# Patient Record
Sex: Male | Born: 1980 | Race: Black or African American | Hispanic: No | Marital: Single | Smoking: Current every day smoker
Health system: Southern US, Community
[De-identification: ages and names within clinical notes are randomized; demographics above are authoritative.]

## PROBLEM LIST (undated history)

## (undated) DIAGNOSIS — S21119A Laceration without foreign body of unspecified front wall of thorax without penetration into thoracic cavity, initial encounter: Secondary | ICD-10-CM

## (undated) DIAGNOSIS — D869 Sarcoidosis, unspecified: Secondary | ICD-10-CM

## (undated) DIAGNOSIS — S31119A Laceration without foreign body of abdominal wall, unspecified quadrant without penetration into peritoneal cavity, initial encounter: Secondary | ICD-10-CM

## (undated) HISTORY — PX: ABDOMINAL SURGERY: SHX537

---

## 2014-09-07 ENCOUNTER — Encounter (HOSPITAL_COMMUNITY): Payer: Self-pay | Admitting: *Deleted

## 2014-09-07 ENCOUNTER — Emergency Department (HOSPITAL_COMMUNITY): Payer: Self-pay

## 2014-09-07 ENCOUNTER — Emergency Department (HOSPITAL_COMMUNITY)
Admission: EM | Admit: 2014-09-07 | Discharge: 2014-09-07 | Disposition: A | Discharge status: Home / Self Care | Payer: Self-pay | Attending: Emergency Medicine | Admitting: Emergency Medicine

## 2014-09-07 DIAGNOSIS — Z862 Personal history of diseases of the blood and blood-forming organs and certain disorders involving the immune mechanism: Secondary | ICD-10-CM | POA: Insufficient documentation

## 2014-09-07 DIAGNOSIS — J209 Acute bronchitis, unspecified: Secondary | ICD-10-CM | POA: Insufficient documentation

## 2014-09-07 DIAGNOSIS — Z87828 Personal history of other (healed) physical injury and trauma: Secondary | ICD-10-CM | POA: Insufficient documentation

## 2014-09-07 DIAGNOSIS — J40 Bronchitis, not specified as acute or chronic: Secondary | ICD-10-CM

## 2014-09-07 DIAGNOSIS — Z720 Tobacco use: Secondary | ICD-10-CM | POA: Insufficient documentation

## 2014-09-07 HISTORY — DX: Laceration without foreign body of abdominal wall, unspecified quadrant without penetration into peritoneal cavity, initial encounter: S31.119A

## 2014-09-07 HISTORY — DX: Sarcoidosis, unspecified: D86.9

## 2014-09-07 HISTORY — DX: Laceration without foreign body of unspecified front wall of thorax without penetration into thoracic cavity, initial encounter: S21.119A

## 2014-09-07 LAB — CBC
HCT: 47.2 % (ref 39.0–52.0)
HEMOGLOBIN: 16.8 g/dL (ref 13.0–17.0)
MCH: 31.1 pg (ref 26.0–34.0)
MCHC: 35.6 g/dL (ref 30.0–36.0)
MCV: 87.2 fL (ref 78.0–100.0)
PLATELETS: 195 10*3/uL (ref 150–400)
RBC: 5.41 MIL/uL (ref 4.22–5.81)
RDW: 12.6 % (ref 11.5–15.5)
WBC: 9.2 10*3/uL (ref 4.0–10.5)

## 2014-09-07 LAB — BRAIN NATRIURETIC PEPTIDE: B Natriuretic Peptide: 14.8 pg/mL (ref 0.0–100.0)

## 2014-09-07 LAB — BASIC METABOLIC PANEL
ANION GAP: 11 (ref 5–15)
BUN: 6 mg/dL (ref 6–20)
CO2: 22 mmol/L (ref 22–32)
Calcium: 9.7 mg/dL (ref 8.9–10.3)
Chloride: 105 mmol/L (ref 101–111)
Creatinine, Ser: 0.88 mg/dL (ref 0.61–1.24)
GFR calc Af Amer: >60 mL/min (ref >60)
GFR calc non Af Amer: >60 mL/min (ref >60)
Glucose, Bld: 106 mg/dL — ABNORMAL HIGH (ref 65–99)
Potassium: 4.2 mmol/L (ref 3.5–5.1)
Sodium: 138 mmol/L (ref 135–145)

## 2014-09-07 LAB — I-STAT TROPONIN, ED: Troponin i, poc: 0 ng/mL (ref 0.00–0.08)

## 2014-09-07 LAB — D-DIMER, QUANTITATIVE (NOT AT ARMC): D-Dimer, Quant: <0.27 ug/mL-FEU (ref 0.00–0.48)

## 2014-09-07 MED ORDER — AEROCHAMBER PLUS W/MASK MISC
1.0000 | Freq: Once | Status: DC
  Filled 2014-09-07: qty 1

## 2014-09-07 MED ORDER — ALBUTEROL SULFATE (2.5 MG/3ML) 0.083% IN NEBU
5.0000 mg | INHALATION_SOLUTION | Freq: Once | RESPIRATORY_TRACT | Status: AC
  Administered 2014-09-07: 5 mg via RESPIRATORY_TRACT
  Filled 2014-09-07: qty 6

## 2014-09-07 MED ORDER — GUAIFENESIN ER 1200 MG PO TB12: 1.0000 | ORAL_TABLET | Freq: Two times a day (BID) | ORAL | Status: DC

## 2014-09-07 MED ORDER — ACETAMINOPHEN-CODEINE 120-12 MG/5ML PO SOLN: 10.0000 mL | ORAL | Status: DC | PRN

## 2014-09-07 MED ORDER — ALBUTEROL SULFATE HFA 108 (90 BASE) MCG/ACT IN AERS
2.0000 | INHALATION_SPRAY | RESPIRATORY_TRACT | Status: DC | PRN
  Administered 2014-09-07: 2 via RESPIRATORY_TRACT
  Filled 2014-09-07: qty 6.7

## 2014-09-07 MED ORDER — PREDNISONE 20 MG PO TABS
60.0000 mg | ORAL_TABLET | Freq: Once | ORAL | Status: AC
  Administered 2014-09-07: 60 mg via ORAL
  Filled 2014-09-07: qty 3

## 2014-09-07 MED ORDER — IPRATROPIUM BROMIDE 0.02 % IN SOLN
0.5000 mg | RESPIRATORY_TRACT | Status: AC
  Administered 2014-09-07: 0.5 mg via RESPIRATORY_TRACT
  Filled 2014-09-07: qty 2.5

## 2014-09-07 MED ORDER — PREDNISONE 50 MG PO TABS: 50.0000 mg | ORAL_TABLET | Freq: Every day | ORAL | Status: DC

## 2014-09-07 NOTE — ED Notes (Signed)
Pt in c/o chest pain that started yesterday, states yesterday the pain was to the right side of his chest and today moved to central chest, reports shortness of breath, denies n/v, pt states he has a history of sarcoidosis

## 2014-09-07 NOTE — Discharge Instructions (Signed)
Return here as needed.  Follow-up with a primary care doctor or an urgent care.  Increase your fluid intake and rest as much possible
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
## 2014-09-07 NOTE — ED Notes (Signed)
Pt also reports productive cough with clear mucus since this morning, denies fever

## 2016-04-11 ENCOUNTER — Encounter (HOSPITAL_COMMUNITY): Payer: Self-pay | Admitting: Emergency Medicine

## 2016-04-11 ENCOUNTER — Emergency Department (HOSPITAL_COMMUNITY): Payer: Self-pay

## 2016-04-11 ENCOUNTER — Emergency Department (HOSPITAL_COMMUNITY)
Admission: EM | Admit: 2016-04-11 | Discharge: 2016-04-11 | Disposition: A | Discharge status: Home / Self Care | Payer: Self-pay | Attending: Physician Assistant | Admitting: Physician Assistant

## 2016-04-11 DIAGNOSIS — R197 Diarrhea, unspecified: Secondary | ICD-10-CM | POA: Insufficient documentation

## 2016-04-11 DIAGNOSIS — F17210 Nicotine dependence, cigarettes, uncomplicated: Secondary | ICD-10-CM | POA: Insufficient documentation

## 2016-04-11 DIAGNOSIS — R112 Nausea with vomiting, unspecified: Secondary | ICD-10-CM | POA: Insufficient documentation

## 2016-04-11 LAB — COMPREHENSIVE METABOLIC PANEL
ALBUMIN: 4.8 g/dL (ref 3.5–5.0)
ALT: 22 U/L (ref 17–63)
AST: 19 U/L (ref 15–41)
Alkaline Phosphatase: 63 U/L (ref 38–126)
Anion gap: 6 (ref 5–15)
BILIRUBIN TOTAL: 1.2 mg/dL (ref 0.3–1.2)
BUN: 9 mg/dL (ref 6–20)
CALCIUM: 9.7 mg/dL (ref 8.9–10.3)
CO2: 29 mmol/L (ref 22–32)
Chloride: 101 mmol/L (ref 101–111)
Creatinine, Ser: 0.9 mg/dL (ref 0.61–1.24)
GFR calc Af Amer: >60 mL/min (ref >60)
GFR calc non Af Amer: >60 mL/min (ref >60)
GLUCOSE: 89 mg/dL (ref 65–99)
Potassium: 3.6 mmol/L (ref 3.5–5.1)
Sodium: 136 mmol/L (ref 135–145)
Total Protein: 8 g/dL (ref 6.5–8.1)

## 2016-04-11 LAB — CBC
HCT: 48.2 % (ref 39.0–52.0)
Hemoglobin: 16.8 g/dL (ref 13.0–17.0)
MCH: 29.9 pg (ref 26.0–34.0)
MCHC: 34.9 g/dL (ref 30.0–36.0)
MCV: 85.9 fL (ref 78.0–100.0)
Platelets: 219 10*3/uL (ref 150–400)
RBC: 5.61 MIL/uL (ref 4.22–5.81)
RDW: 12.5 % (ref 11.5–15.5)
WBC: 7.1 10*3/uL (ref 4.0–10.5)

## 2016-04-11 LAB — URINALYSIS, ROUTINE W REFLEX MICROSCOPIC
BILIRUBIN URINE: NEGATIVE
Glucose, UA: NEGATIVE mg/dL
HGB URINE DIPSTICK: NEGATIVE
KETONES UR: NEGATIVE mg/dL
Leukocytes, UA: NEGATIVE
Nitrite: NEGATIVE
Protein, ur: NEGATIVE mg/dL
Specific Gravity, Urine: 1.018 (ref 1.005–1.030)
pH: 9 — ABNORMAL HIGH (ref 5.0–8.0)

## 2016-04-11 LAB — LIPASE, BLOOD: Lipase: 29 U/L (ref 11–51)

## 2016-04-11 MED ORDER — IOPAMIDOL (ISOVUE-300) INJECTION 61%
INTRAVENOUS | Status: AC
  Administered 2016-04-11: 100 mL
  Filled 2016-04-11: qty 100

## 2016-04-11 MED ORDER — ALUM & MAG HYDROXIDE-SIMETH 200-200-20 MG/5ML PO SUSP: 30.0000 mL | Freq: Four times a day (QID) | ORAL | 0 refills | Status: DC | PRN

## 2016-04-11 MED ORDER — METOCLOPRAMIDE HCL 5 MG/ML IJ SOLN
10.0000 mg | Freq: Once | INTRAMUSCULAR | Status: AC
  Administered 2016-04-11: 10 mg via INTRAVENOUS
  Filled 2016-04-11: qty 2

## 2016-04-11 MED ORDER — GI COCKTAIL ~~LOC~~
30.0000 mL | Freq: Once | ORAL | Status: AC
  Administered 2016-04-11: 30 mL via ORAL
  Filled 2016-04-11: qty 30

## 2016-04-11 MED ORDER — ONDANSETRON HCL 4 MG PO TABS: 4.0000 mg | ORAL_TABLET | Freq: Three times a day (TID) | ORAL | 0 refills | Status: DC | PRN

## 2016-04-11 MED ORDER — SODIUM CHLORIDE 0.9 % IV BOLUS (SEPSIS)
1000.0000 mL | Freq: Once | INTRAVENOUS | Status: AC
  Administered 2016-04-11: 1000 mL via INTRAVENOUS

## 2016-04-11 MED ORDER — OXYCODONE-ACETAMINOPHEN 5-325 MG PO TABS
1.0000 | ORAL_TABLET | Freq: Once | ORAL | Status: AC
Start: 2016-04-11 — End: 2016-04-11
  Administered 2016-04-11: 1 via ORAL
  Filled 2016-04-11: qty 1
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
## 2016-04-11 NOTE — ED Notes (Signed)
ED Provider at bedside. 

## 2016-04-11 NOTE — Discharge Instructions (Signed)
Your labs are reassuring. Normal vital signs and her CAT scan showed evidence of likely viral gastroenteritis. If not improving in the next 48 hours. Return to the emergency department. Also return if you are unable to keep liquids down to keep herself hydrated. [place] if you are not able to pass gas/use the bathroom.

## 2016-04-11 NOTE — ED Triage Notes (Signed)
Pt c/o epigastric pain that started last night and has been constant.  Patient states that he has had BMs but not diarrhea. Patient denies any n/v.

## 2016-04-11 NOTE — ED Notes (Signed)
Pt has urinal at bedside 

## 2016-04-11 NOTE — ED Notes (Signed)
Patient made aware of urine sample. Urinal placed at bedside and patient encouraged to void when able. 

## 2017-02-03 ENCOUNTER — Emergency Department (HOSPITAL_COMMUNITY)
Admission: EM | Admit: 2017-02-03 | Discharge: 2017-02-04 | Disposition: A | Discharge status: Other Acute Inpatient Hospital | Payer: Self-pay | Attending: Emergency Medicine | Admitting: Emergency Medicine

## 2017-02-03 ENCOUNTER — Encounter (HOSPITAL_COMMUNITY): Payer: Self-pay | Admitting: Emergency Medicine

## 2017-02-03 ENCOUNTER — Emergency Department (HOSPITAL_COMMUNITY): Payer: Self-pay

## 2017-02-03 DIAGNOSIS — F17210 Nicotine dependence, cigarettes, uncomplicated: Secondary | ICD-10-CM | POA: Insufficient documentation

## 2017-02-03 DIAGNOSIS — Z046 Encounter for general psychiatric examination, requested by authority: Secondary | ICD-10-CM | POA: Insufficient documentation

## 2017-02-03 DIAGNOSIS — R0789 Other chest pain: Secondary | ICD-10-CM | POA: Insufficient documentation

## 2017-02-03 DIAGNOSIS — R45851 Suicidal ideations: Secondary | ICD-10-CM | POA: Insufficient documentation

## 2017-02-03 DIAGNOSIS — F1410 Cocaine abuse, uncomplicated: Secondary | ICD-10-CM | POA: Insufficient documentation

## 2017-02-03 LAB — BASIC METABOLIC PANEL
Anion gap: 13 (ref 5–15)
BUN: 8 mg/dL (ref 6–20)
CO2: 22 mmol/L (ref 22–32)
CREATININE: 0.84 mg/dL (ref 0.61–1.24)
Calcium: 9.4 mg/dL (ref 8.9–10.3)
Chloride: 104 mmol/L (ref 101–111)
GFR calc Af Amer: >60 mL/min (ref >60)
GLUCOSE: 204 mg/dL — AB (ref 65–99)
POTASSIUM: 3.6 mmol/L (ref 3.5–5.1)
SODIUM: 139 mmol/L (ref 135–145)

## 2017-02-03 LAB — RAPID URINE DRUG SCREEN, HOSP PERFORMED
Amphetamines: NOT DETECTED
Barbiturates: NOT DETECTED
Benzodiazepines: NOT DETECTED
Cocaine: POSITIVE — AB
OPIATES: NOT DETECTED
TETRAHYDROCANNABINOL: NOT DETECTED

## 2017-02-03 LAB — I-STAT TROPONIN, ED
TROPONIN I, POC: 0 ng/mL (ref 0.00–0.08)
Troponin i, poc: 0 ng/mL (ref 0.00–0.08)

## 2017-02-03 LAB — CBC
HEMATOCRIT: 48.9 % (ref 39.0–52.0)
Hemoglobin: 17.2 g/dL — ABNORMAL HIGH (ref 13.0–17.0)
MCH: 30.9 pg (ref 26.0–34.0)
MCHC: 35.2 g/dL (ref 30.0–36.0)
MCV: 87.9 fL (ref 78.0–100.0)
Platelets: 279 10*3/uL (ref 150–400)
RBC: 5.56 MIL/uL (ref 4.22–5.81)
RDW: 13 % (ref 11.5–15.5)
WBC: 7.2 10*3/uL (ref 4.0–10.5)

## 2017-02-03 LAB — ETHANOL: Alcohol, Ethyl (B): 41 mg/dL — ABNORMAL HIGH (ref <10)

## 2017-02-03 LAB — URINALYSIS, COMPLETE (UACMP) WITH MICROSCOPIC
Bilirubin Urine: NEGATIVE
Glucose, UA: NEGATIVE mg/dL
HGB URINE DIPSTICK: NEGATIVE
KETONES UR: NEGATIVE mg/dL
LEUKOCYTES UA: NEGATIVE
Nitrite: NEGATIVE
PROTEIN: NEGATIVE mg/dL
SQUAMOUS EPITHELIAL / LPF: NONE SEEN
Specific Gravity, Urine: 1.029 (ref 1.005–1.030)
pH: 5 (ref 5.0–8.0)

## 2017-02-03 LAB — SALICYLATE LEVEL: Salicylate Lvl: <7 mg/dL (ref 2.8–30.0)

## 2017-02-03 LAB — D-DIMER, QUANTITATIVE: D-Dimer, Quant: 0.31 ug/mL-FEU (ref 0.00–0.50)

## 2017-02-03 LAB — ACETAMINOPHEN LEVEL

## 2017-02-03 MED ORDER — LORAZEPAM 1 MG PO TABS
0.0000 mg | ORAL_TABLET | Freq: Four times a day (QID) | ORAL | Status: DC
  Administered 2017-02-04: 1 mg via ORAL
  Filled 2017-02-03: qty 1

## 2017-02-03 MED ORDER — VITAMIN B-1 100 MG PO TABS
100.0000 mg | ORAL_TABLET | Freq: Every day | ORAL | Status: DC
  Administered 2017-02-03 – 2017-02-04 (×2): 100 mg via ORAL
  Filled 2017-02-03 (×2): qty 1

## 2017-02-03 MED ORDER — THIAMINE HCL 100 MG/ML IJ SOLN: 100.0000 mg | Freq: Every day | INTRAMUSCULAR | Status: DC

## 2017-02-03 MED ORDER — LORAZEPAM 1 MG PO TABS
0.0000 mg | ORAL_TABLET | Freq: Two times a day (BID) | ORAL | Status: DC
Start: 2017-02-06 — End: 2017-02-04

## 2017-02-03 MED ORDER — ASPIRIN 81 MG PO CHEW
324.0000 mg | CHEWABLE_TABLET | Freq: Once | ORAL | Status: AC
  Administered 2017-02-03: 324 mg via ORAL
  Filled 2017-02-03: qty 4

## 2017-02-03 MED ORDER — LORAZEPAM 2 MG/ML IJ SOLN: 0.0000 mg | Freq: Two times a day (BID) | INTRAMUSCULAR | Status: DC

## 2017-02-03 MED ORDER — ACETAMINOPHEN 325 MG PO TABS: 650.0000 mg | ORAL_TABLET | ORAL | Status: DC | PRN

## 2017-02-03 MED ORDER — LORAZEPAM 2 MG/ML IJ SOLN: 0.0000 mg | Freq: Four times a day (QID) | INTRAMUSCULAR | Status: DC

## 2017-02-03 MED ORDER — ONDANSETRON HCL 4 MG PO TABS: 4.0000 mg | ORAL_TABLET | Freq: Three times a day (TID) | ORAL | Status: DC | PRN

## 2017-02-03 MED ORDER — ALUM & MAG HYDROXIDE-SIMETH 200-200-20 MG/5ML PO SUSP: 30.0000 mL | Freq: Four times a day (QID) | ORAL | Status: DC | PRN

## 2017-02-03 MED ORDER — LORAZEPAM 1 MG PO TABS
1.0000 mg | ORAL_TABLET | Freq: Once | ORAL | Status: AC
  Administered 2017-02-03: 1 mg via ORAL
  Filled 2017-02-03: qty 1

## 2017-02-03 NOTE — ED Notes (Signed)
Pt has 3 bags of belongings- 1)clothes, 2)coat, and 3)shoes with patient stickers on bags.  Bags were [place] in [place].
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
## 2017-02-03 NOTE — ED Triage Notes (Signed)
Pt. Came back into triage to make sure the doctor will evaluate his alcohol problem and is requesting help for alcohol problem Last drank yesterday . Drank 6, 40 oz. Beer

## 2017-02-03 NOTE — ED Notes (Signed)
PT came to see me again in the lobby, requesting help for his Si. Spoke to American FinancialKaren RN and requested to bring PT back to Triage.

## 2017-02-03 NOTE — ED Notes (Signed)
X-Ray Tech came to see me stating that the PT had spoken to him at he is feeling Si. Spoke to PT and confirmed that he is feeling Si and has been feeling like this for 3-4 days. Bought PT back to triage and placed in room 1
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
## 2017-02-03 NOTE — ED Triage Notes (Signed)
Pt. Stated, I started having chest pain today dinies any other symptoms

## 2017-02-03 NOTE — ED Notes (Signed)
Tech first brought pt from waiting room and reports that pt is suicidal.  RN spoke with pt and he states that he is suicidal but denies having a plan.

## 2017-02-04 ENCOUNTER — Inpatient Hospital Stay (HOSPITAL_COMMUNITY)
Admission: AD | Admit: 2017-02-04 | Discharge: 2017-02-14 | DRG: 885 | Payer: Federal, State, Local not specified - Other | Source: Intra-hospital | Attending: Psychiatry | Admitting: Psychiatry

## 2017-02-04 ENCOUNTER — Other Ambulatory Visit: Payer: Self-pay

## 2017-02-04 ENCOUNTER — Encounter (HOSPITAL_COMMUNITY): Payer: Self-pay

## 2017-02-04 DIAGNOSIS — Z23 Encounter for immunization: Secondary | ICD-10-CM

## 2017-02-04 DIAGNOSIS — D869 Sarcoidosis, unspecified: Secondary | ICD-10-CM | POA: Diagnosis present

## 2017-02-04 DIAGNOSIS — F332 Major depressive disorder, recurrent severe without psychotic features: Secondary | ICD-10-CM | POA: Diagnosis not present

## 2017-02-04 DIAGNOSIS — Z590 Homelessness: Secondary | ICD-10-CM

## 2017-02-04 DIAGNOSIS — F419 Anxiety disorder, unspecified: Secondary | ICD-10-CM | POA: Diagnosis present

## 2017-02-04 DIAGNOSIS — F17210 Nicotine dependence, cigarettes, uncomplicated: Secondary | ICD-10-CM | POA: Diagnosis not present

## 2017-02-04 DIAGNOSIS — Y902 Blood alcohol level of 40-59 mg/100 ml: Secondary | ICD-10-CM | POA: Diagnosis present

## 2017-02-04 DIAGNOSIS — Z8659 Personal history of other mental and behavioral disorders: Secondary | ICD-10-CM | POA: Diagnosis not present

## 2017-02-04 DIAGNOSIS — F1010 Alcohol abuse, uncomplicated: Secondary | ICD-10-CM | POA: Diagnosis not present

## 2017-02-04 DIAGNOSIS — F322 Major depressive disorder, single episode, severe without psychotic features: Secondary | ICD-10-CM | POA: Insufficient documentation

## 2017-02-04 DIAGNOSIS — F1410 Cocaine abuse, uncomplicated: Secondary | ICD-10-CM | POA: Diagnosis not present

## 2017-02-04 DIAGNOSIS — F329 Major depressive disorder, single episode, unspecified: Secondary | ICD-10-CM | POA: Diagnosis present

## 2017-02-04 DIAGNOSIS — R45851 Suicidal ideations: Secondary | ICD-10-CM | POA: Diagnosis present

## 2017-02-04 DIAGNOSIS — F323 Major depressive disorder, single episode, severe with psychotic features: Principal | ICD-10-CM | POA: Diagnosis present

## 2017-02-04 DIAGNOSIS — F1994 Other psychoactive substance use, unspecified with psychoactive substance-induced mood disorder: Secondary | ICD-10-CM | POA: Diagnosis present

## 2017-02-04 DIAGNOSIS — F1420 Cocaine dependence, uncomplicated: Secondary | ICD-10-CM | POA: Diagnosis not present

## 2017-02-04 DIAGNOSIS — F1020 Alcohol dependence, uncomplicated: Secondary | ICD-10-CM | POA: Diagnosis present

## 2017-02-04 DIAGNOSIS — Z813 Family history of other psychoactive substance abuse and dependence: Secondary | ICD-10-CM

## 2017-02-04 DIAGNOSIS — G4700 Insomnia, unspecified: Secondary | ICD-10-CM | POA: Diagnosis not present

## 2017-02-04 DIAGNOSIS — Z811 Family history of alcohol abuse and dependence: Secondary | ICD-10-CM

## 2017-02-04 DIAGNOSIS — F333 Major depressive disorder, recurrent, severe with psychotic symptoms: Secondary | ICD-10-CM | POA: Diagnosis not present

## 2017-02-04 MED ORDER — ONDANSETRON 4 MG PO TBDP: 4.0000 mg | ORAL_TABLET | Freq: Four times a day (QID) | ORAL | Status: AC | PRN

## 2017-02-04 MED ORDER — TRAZODONE HCL 50 MG PO TABS
50.0000 mg | ORAL_TABLET | Freq: Every evening | ORAL | Status: DC | PRN
  Administered 2017-02-04 – 2017-02-05 (×2): 50 mg via ORAL
  Filled 2017-02-04: qty 1

## 2017-02-04 MED ORDER — ALUM & MAG HYDROXIDE-SIMETH 200-200-20 MG/5ML PO SUSP
30.0000 mL | ORAL | Status: DC | PRN
  Administered 2017-02-05 – 2017-02-13 (×7): 30 mL via ORAL
  Filled 2017-02-04 (×7): qty 30

## 2017-02-04 MED ORDER — ACETAMINOPHEN 325 MG PO TABS: 650.0000 mg | ORAL_TABLET | Freq: Four times a day (QID) | ORAL | Status: DC | PRN

## 2017-02-04 MED ORDER — PNEUMOCOCCAL VAC POLYVALENT 25 MCG/0.5ML IJ INJ
0.5000 mL | INJECTION | INTRAMUSCULAR | Status: AC
  Administered 2017-02-05: 0.5 mL via INTRAMUSCULAR

## 2017-02-04 MED ORDER — LORAZEPAM 1 MG PO TABS: 1.0000 mg | ORAL_TABLET | Freq: Four times a day (QID) | ORAL | Status: AC | PRN

## 2017-02-04 MED ORDER — HYDROXYZINE HCL 25 MG PO TABS
25.0000 mg | ORAL_TABLET | Freq: Four times a day (QID) | ORAL | Status: AC | PRN
  Administered 2017-02-05 – 2017-02-07 (×2): 25 mg via ORAL
  Filled 2017-02-04 (×2): qty 1

## 2017-02-04 MED ORDER — ALUM & MAG HYDROXIDE-SIMETH 200-200-20 MG/5ML PO SUSP: 30.0000 mL | ORAL | Status: DC | PRN

## 2017-02-04 MED ORDER — MAGNESIUM HYDROXIDE 400 MG/5ML PO SUSP: 30.0000 mL | Freq: Every day | ORAL | Status: DC | PRN

## 2017-02-04 MED ORDER — NICOTINE 21 MG/24HR TD PT24
21.0000 mg | MEDICATED_PATCH | Freq: Every day | TRANSDERMAL | Status: DC
  Administered 2017-02-04 – 2017-02-13 (×10): 21 mg via TRANSDERMAL
  Filled 2017-02-04 (×2): qty 1
  Filled 2017-02-04: qty 14
  Filled 2017-02-04 (×12): qty 1

## 2017-02-04 MED ORDER — LORAZEPAM 1 MG PO TABS
1.0000 mg | ORAL_TABLET | Freq: Two times a day (BID) | ORAL | Status: AC
  Administered 2017-02-06 – 2017-02-07 (×2): 1 mg via ORAL
  Filled 2017-02-04 (×2): qty 1

## 2017-02-04 MED ORDER — INFLUENZA VAC SPLIT QUAD 0.5 ML IM SUSY
0.5000 mL | PREFILLED_SYRINGE | INTRAMUSCULAR | Status: AC
  Administered 2017-02-05: 0.5 mL via INTRAMUSCULAR
  Filled 2017-02-04: qty 0.5

## 2017-02-04 MED ORDER — LORAZEPAM 1 MG PO TABS
1.0000 mg | ORAL_TABLET | Freq: Three times a day (TID) | ORAL | Status: AC
  Administered 2017-02-05 – 2017-02-06 (×3): 1 mg via ORAL
  Filled 2017-02-04 (×3): qty 1

## 2017-02-04 MED ORDER — LORAZEPAM 1 MG PO TABS
1.0000 mg | ORAL_TABLET | Freq: Four times a day (QID) | ORAL | Status: AC
  Administered 2017-02-04 – 2017-02-05 (×4): 1 mg via ORAL
  Filled 2017-02-04 (×4): qty 1

## 2017-02-04 MED ORDER — LOPERAMIDE HCL 2 MG PO CAPS
2.0000 mg | ORAL_CAPSULE | ORAL | Status: AC | PRN
  Administered 2017-02-04: 4 mg via ORAL
  Filled 2017-02-04: qty 2

## 2017-02-04 MED ORDER — ADULT MULTIVITAMIN W/MINERALS CH
1.0000 | ORAL_TABLET | Freq: Every day | ORAL | Status: DC
  Administered 2017-02-04 – 2017-02-13 (×10): 1 via ORAL
  Filled 2017-02-04 (×15): qty 1

## 2017-02-04 MED ORDER — VITAMIN B-1 100 MG PO TABS
100.0000 mg | ORAL_TABLET | Freq: Every day | ORAL | Status: DC
  Administered 2017-02-05 – 2017-02-13 (×9): 100 mg via ORAL
  Filled 2017-02-04 (×5): qty 1
  Filled 2017-02-04: qty 14
  Filled 2017-02-04 (×8): qty 1

## 2017-02-04 MED ORDER — ACETAMINOPHEN 325 MG PO TABS
650.0000 mg | ORAL_TABLET | Freq: Four times a day (QID) | ORAL | Status: DC | PRN
  Administered 2017-02-04 – 2017-02-08 (×5): 650 mg via ORAL
  Filled 2017-02-04 (×5): qty 2

## 2017-02-04 MED ORDER — LORAZEPAM 1 MG PO TABS
1.0000 mg | ORAL_TABLET | Freq: Every day | ORAL | Status: AC
  Administered 2017-02-08: 1 mg via ORAL
  Filled 2017-02-04: qty 1

## 2017-02-04 NOTE — Progress Notes (Signed)
Patient ID: Cody Meyer, male   DOB: [birth year], 36 y.o.   MRN: [id number]  Pt currently presents with a blunted affect and cooperative, guarded behavior. Presents with signs and symptoms of withdrawal including fatigue, generalized aches and diarrhea. Pt reports to writer that their goal is to "feel better." Pt forwards little to Clinical research associatewriter during interaction. Pt reports good sleep with current medication regimen.   Pt provided with scheduled and as needed medications per providers orders. Pt's labs and vitals were monitored throughout the night. Pt given a 1:1 about emotional and mental status. Pt supported and encouraged to express concerns and questions. Pt educated on medications and withdrawal symptoms.  Pt's safety ensured with 15 minute and environmental checks. Pt currently denies SI/HI and A/V hallucinations. Pt verbally agrees to seek staff if SI/HI or A/VH occurs and to consult with staff before acting on any harmful thoughts.  Will continue POC.

## 2017-02-04 NOTE — Progress Notes (Signed)
Leslie Andreaerlie is a 36 year old male being admitted voluntarily to 307-1 from [facility].  He came into the ED for suicidal ideation, increase in depression, alcohol use and cocaine use.  He reported plan to jump into traffic.  He has medical history of Sarcoidosis.  He is diagnosed with Major Depressive Disorder, Alcohol Use Disorder and Stimulant-related Disorder(cocaine).  [facility] he denied SI/HI or A/V hallucinations.  He reported that he was just getting tired of using and "I can't keep up anymore."  He admitted using alcohol and cocaine daily and being homeless has his stressors.  He reported head ache 9/10 since coming to the hospital.   Oriented him to the unit.  Admission paperwork completed and signed.  Belongings searched and secured in locker # 38.  Skin assessment completed and no skin issues noted.  Q 15 minute checks initiated for safety.  We will monitor the progress towards his goals.

## 2017-02-04 NOTE — Progress Notes (Signed)
Per Berneice Heinrichina Tate , [facility] ; Accepting provider is Hillery Jacksanika Lewis, NP; Attending provider is Dr. Jama Flavorsobos.  Patient can arrive at 1:00pm. Number for report is [phone].   Lew DawesKelly Moon, RN notified.    Baldo DaubJolan Maile Linford MSW, LCSWA CSW Disposition [phone]

## 2017-02-04 NOTE — Tx Team (Signed)
Initial Treatment Plan 02/04/2017 3:45 PM Cody Meyer ZOX:[id number]    PATIENT STRESSORS: Financial difficulties Health problems Substance abuse   PATIENT STRENGTHS: Capable of independent living Communication skills General fund of knowledge Work skills   PATIENT IDENTIFIED PROBLEMS: Depression  Suicidal ideation  Substance abuse  "Get my life straight"  "Get off drugs and alcohol"             DISCHARGE CRITERIA:  Improved stabilization in mood, thinking, and/or behavior Verbal commitment to aftercare and medication compliance Withdrawal symptoms are absent or subacute and managed without 24-hour nursing intervention  PRELIMINARY DISCHARGE PLAN: Outpatient therapy Medication management  PATIENT/FAMILY INVOLVEMENT: This treatment plan has been presented to and reviewed with the patient, Cody LangePerlie Meyer.  The patient and family have been given the opportunity to ask questions and make suggestions.  Levin BaconHeather V Adley Castello, RN 02/04/2017, 3:45 PM

## 2017-02-04 NOTE — ED Notes (Signed)
Behavioral health recommends inpatient treatment.  [facility] on [facility]5 after 10am.

## 2017-02-05 DIAGNOSIS — R450 Nervousness: Secondary | ICD-10-CM

## 2017-02-05 DIAGNOSIS — F419 Anxiety disorder, unspecified: Secondary | ICD-10-CM

## 2017-02-05 DIAGNOSIS — F1010 Alcohol abuse, uncomplicated: Secondary | ICD-10-CM

## 2017-02-05 DIAGNOSIS — F17210 Nicotine dependence, cigarettes, uncomplicated: Secondary | ICD-10-CM

## 2017-02-05 DIAGNOSIS — R4587 Impulsiveness: Secondary | ICD-10-CM

## 2017-02-05 DIAGNOSIS — G4700 Insomnia, unspecified: Secondary | ICD-10-CM

## 2017-02-05 DIAGNOSIS — F1020 Alcohol dependence, uncomplicated: Secondary | ICD-10-CM

## 2017-02-05 DIAGNOSIS — R4582 Worries: Secondary | ICD-10-CM

## 2017-02-05 DIAGNOSIS — F1410 Cocaine abuse, uncomplicated: Secondary | ICD-10-CM

## 2017-02-05 LAB — LIPID PANEL
CHOL/HDL RATIO: 3.2 ratio
Cholesterol: 175 mg/dL (ref 0–200)
HDL: 55 mg/dL (ref >40)
LDL CALC: 103 mg/dL — AB (ref 0–99)
Triglycerides: 85 mg/dL (ref <150)
VLDL: 17 mg/dL (ref 0–40)

## 2017-02-05 LAB — HEMOGLOBIN A1C
Hgb A1c MFr Bld: 5.7 % — ABNORMAL HIGH (ref 4.8–5.6)
Mean Plasma Glucose: 116.89 mg/dL

## 2017-02-05 LAB — TSH: TSH: 2.259 u[IU]/mL (ref 0.350–4.500)

## 2017-02-05 MED ORDER — ALBUTEROL SULFATE HFA 108 (90 BASE) MCG/ACT IN AERS
1.0000 | INHALATION_SPRAY | Freq: Four times a day (QID) | RESPIRATORY_TRACT | Status: DC | PRN
  Administered 2017-02-05 – 2017-02-13 (×14): 2 via RESPIRATORY_TRACT
  Filled 2017-02-05: qty 6.7

## 2017-02-05 MED ORDER — FLUOXETINE HCL 20 MG PO CAPS
20.0000 mg | ORAL_CAPSULE | Freq: Every day | ORAL | Status: DC
  Administered 2017-02-05 – 2017-02-13 (×9): 20 mg via ORAL
  Filled 2017-02-05 (×13): qty 1

## 2017-02-05 NOTE — BHH Suicide Risk Assessment (Signed)
[facility]   Nursing information obtained from:  Patient Demographic factors:  Male, Low socioeconomic status Current Mental Status:  Self-harm thoughts Loss Factors:  Financial problems / change in socioeconomic status Historical Factors:  Family history of suicide, Family history of mental illness or substance abuse Risk Reduction Factors:  Employed  Total Time spent with patient: 30 minutes Principal Problem: MDD (major depressive disorder) Diagnosis:   Patient Active Problem List   Diagnosis Date Noted  . Severe major depression (HCC) [F32.2] 02/04/2017  . MDD (major depressive disorder) [F32.9] 02/04/2017   Subjective Data:  Cody Meyer is a 36 y/o M with history of MDD and substance use of cocaine and alcohol who presented with worsening depression and SI with plan to jump into traffic. Pt shares his reasons for coming to the hospital, stating, "I was having chest pain, so I went to the hosiptal, but I was also feeling real down and having thoughts about jumping out in the street." Pt associates his SI with guilty feelings and ongoing struggles with substance use, which pt indicates he has struggled with since his teens. He typically drinks about five or six 40 ounce beers per day and he uses cocaine when available. He denies current SI/HI/AH/VH. He reports recent stressor of losing his housing and being homeless for the past 2 days. He has not tried psychotropic medications before but he is open to a trial at this time. He is open to referral to substance treatment, and he thinks that a residential treatment would be the most successful option for him. Discussed with patient about treatment options. He agreed to trial of prozac to address his mood symptoms. We also discussed starting the CIWA protocol as pt has had symptoms of withdrawal in the past and his last use of alcohol was about 24 hours before admission. Pt had no further questions, comments, or  concerns.   Continued Clinical Symptoms:  Alcohol Use Disorder Identification Test Final Score (AUDIT): 35 The "Alcohol Use Disorders Identification Test", Guidelines for Use in Primary Care, Second Edition.  World Science writerHealth Organization [facility] Low(WHO). Score between 0-7:  no or low risk or alcohol related problems. Score between 8-15:  moderate risk of alcohol related problems. Score between 16-19:  high risk of alcohol related problems. Score 20 or above:  warrants further diagnostic evaluation for alcohol dependence and treatment.   CLINICAL FACTORS:   Depression:   Comorbid alcohol abuse/dependence Alcohol/Substance Abuse/Dependencies   Musculoskeletal: Strength & Muscle Tone: within normal limits Gait & Station: normal Patient leans: N/A  Psychiatric Specialty Exam: Physical Exam  Nursing note and vitals reviewed.   Review of Systems  Constitutional: Negative for chills and fever.  Respiratory: Negative for cough.   Gastrointestinal: Negative for heartburn, nausea and vomiting.  Psychiatric/Behavioral: Negative for depression, hallucinations and suicidal ideas. The patient is not nervous/anxious.     Blood pressure 123/78, pulse 96, temperature 98.1 F (36.7 C), temperature source Oral, resp. rate 18, height 5' 6.5" (1.689 m), weight 77.8 kg (171 lb 8 oz).Body mass index is 27.27 kg/m.  General Appearance: Casual and Fairly Groomed  Eye Contact:  Good  Speech:  Clear and Coherent and Normal Rate  Volume:  Normal  Mood:  Anxious and Depressed  Affect:  Congruent and Constricted  Thought Process:  Coherent and Goal Directed  Orientation:  Full (Time, Place, and Person)  Thought Content:  Logical  Suicidal Thoughts:  No  Homicidal Thoughts:  No  Memory:  Immediate;  Good Recent;   Good Remote;   Good  Judgement:  Fair  Insight:  Lacking  Psychomotor Activity:  Normal  Concentration:  Concentration: Good  Recall:  Good  Fund of Knowledge:  Good  Language:  Fair   Akathisia:  No  Handed:    AIMS (if indicated):     Assets:  Communication Skills Leisure Time Physical Health Resilience Social Support  ADL's:  Intact  Cognition:  WNL  Sleep:  Number of Hours: 4.5      COGNITIVE FEATURES THAT CONTRIBUTE TO RISK:  None    SUICIDE RISK:   Mild:  Suicidal ideation of limited frequency, intensity, duration, and specificity.  There are no identifiable plans, no associated intent, mild dysphoria and related symptoms, good self-control (both objective and subjective assessment), few other risk factors, and identifiable protective factors, including available and accessible social support.  PLAN OF CARE:  - Admit to the inpatient psychiatry unit - MDD  - Start prozac 20mg  qDay - Alcohol withdrawal  - Start CIWA protocol with ativan - Encourage participation in groups and the therapeutic milieu - Discharge planning will be ongoing  I certify that inpatient services furnished can reasonably be expected to improve the patient's condition.   Micheal Likens, MD 02/05/2017, 4:12 PM

## 2017-02-05 NOTE — Progress Notes (Signed)
Recreation Therapy Notes  Animal-Assisted Activity (AAA) Program Checklist/Progress Notes Patient Eligibility Criteria Checklist & Daily Group note for Rec TxIntervention  Date: 11.06.2018 Time: 2:45pm [facility]   AAA/T Program Assumption of Risk Form signed by Patient/ or Parent Legal Guardian Yes  Patient is free of allergies or sever asthma Yes  Patient reports no fear of animals Yes  Patient reports no history of cruelty to animals Yes  Patient understands his/her participation is voluntary Yes  Behavioral Response: Did not attend.   Marykay Lexenise L Mistey Hoffert, LRT/CTRS        Cheyeanne Roadcap L 02/05/2017 2:55 PM
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
## 2017-02-05 NOTE — Progress Notes (Signed)
Patient ID: Cody Meyer, male   DOB: 23-Sep-1980, 36 y.o.   MRN: [id number]  Pt currently presents with a brighter affect and anxious behavior. Pt seen interacting appropriately with peers in the group room tonight. Pt states "it was a good day." Pt denies any current diarrhea but endorses intermittent gas. Pt reports good sleep with current medication regimen.   Pt provided with medications per providers orders. Pt's labs and vitals were monitored throughout the night. Pt given a 1:1 about emotional and mental status. Pt supported and encouraged to express concerns and questions. Pt educated on medications.  Pt's safety ensured with 15 minute and environmental checks. Pt currently denies SI/HI and A/V hallucinations. Pt verbally agrees to seek staff if SI/HI or A/VH occurs and to consult with staff before acting on any harmful thoughts. Will continue POC.

## 2017-02-05 NOTE — [facility] Suicide Risk Assessment (Signed)
[facility] INPATIENT:  Family/Significant Other Suicide Prevention Education  Suicide Prevention Education:  Patient Refusal for Family/Significant Other Suicide Prevention Education: The patient Cody Meyer has refused to provide written consent for family/significant other to be provided Family/Significant Other Suicide Prevention Education during admission and/or prior to discharge.  Physician notified.  SPE completed with pt, as pt refused to consent to family contact. SPI pamphlet provided to pt and pt was encouraged to share information with support network, ask questions, and talk about any concerns relating to SPE. Pt denies access to guns/firearms and verbalized understanding of information provided. Mobile Crisis information also provided to pt.   Myrlene Riera N Smart LCSW 02/05/2017, 4:02 PM

## 2017-02-05 NOTE — Progress Notes (Signed)
Dar Note: Patient presents with flat affect and depressed mood.  Denies auditory and visual hallucinations.  Medications given as prescribed.  Reports withdrawal symptoms of irritability, anxiety, headache during assessment.  Patient visible in the dayroom with minimal interaction with staff and peers.  Routine safety checks continues.  Attended group with minimal participation.  Support and encouragement offered as needed. Patient is safe on the unit.
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
## 2017-02-05 NOTE — Progress Notes (Signed)
Adult Psychoeducational Group Note  Date:  02/05/2017 Time:  4:14 AM  Group Topic/Focus:  Wrap-Up Group:   The focus of this group is to help patients review their daily goal of treatment and discuss progress on daily workbooks.  Participation Level:  Active  Participation Quality:  Appropriate  Affect:  Appropriate  Cognitive:  Appropriate  Insight: Appropriate  Engagement in Group:  Engaged  Modes of Intervention:  Discussion  Additional Comments:  Pt attended the AA group this evening.  Cody FurnaceChristopher  Ewen Meyer 02/05/2017, 4:14 AM

## 2017-02-05 NOTE — BHH Group Notes (Signed)
LCSW Group Therapy Note   02/05/2017 1:15pm   Type of Therapy and Topic:  Group Therapy:  Overcoming Obstacles   Participation Level:  Minimal   Description of Group:    In this group patients will be encouraged to explore what they see as obstacles to their own wellness and recovery. They will be guided to discuss their thoughts, feelings, and behaviors related to these obstacles. The group will process together ways to cope with barriers, with attention given to specific choices patients can make. Each patient will be challenged to identify changes they are motivated to make in order to overcome their obstacles. This group will be process-oriented, with patients participating in exploration of their own experiences as well as giving and receiving support and challenge from other group members.   Therapeutic Goals: 1. Patient will identify personal and current obstacles as they relate to admission. 2. Patient will identify barriers that currently interfere with their wellness or overcoming obstacles.  3. Patient will identify feelings, thought process and behaviors related to these barriers. 4. Patient will identify two changes they are willing to make to overcome these obstacles:      Summary of Patient Progress   Cody Meyer was attentive and engaged during today's processing group. He shared that he has an upcoming "civil court case" and is worried about missing court. He is also interested in residential treatment. Cody Meyer was receptive to options discussed in the group setting and reports that he is currently homeless and unemployed. HE continues to demonstrate improving insight and progress in the group setting.    Therapeutic Modalities:   Cognitive Behavioral Therapy Solution Focused Therapy Motivational Interviewing Relapse Prevention Therapy  Ledell PeoplesHeather N Smart, LCSW 02/05/2017 3:36 PM

## 2017-02-06 DIAGNOSIS — F1020 Alcohol dependence, uncomplicated: Secondary | ICD-10-CM

## 2017-02-06 DIAGNOSIS — F332 Major depressive disorder, recurrent severe without psychotic features: Secondary | ICD-10-CM

## 2017-02-06 MED ORDER — TRAZODONE HCL 100 MG PO TABS
100.0000 mg | ORAL_TABLET | Freq: Every day | ORAL | Status: DC
  Administered 2017-02-06 – 2017-02-08 (×3): 100 mg via ORAL
  Filled 2017-02-06 (×6): qty 1

## 2017-02-06 NOTE — Progress Notes (Signed)
DAR NOTE: Pt present with flat affect and depressed mood in the unit. Pt has been isolating himself and has been bed most of the time. Pt denies physical pain, took all his meds as scheduled. As per self inventory, pt had a fair night sleep, fair appetite, low energy, and poor concentration. Pt rate depression at 7, hopeless ness at 4, and anxiety at 3. Pt's safety ensured with 15 minute and environmental checks. Pt currently denies SI/HI and A/V hallucinations. Pt verbally agrees to seek staff if SI/HI or A/VH occurs and to consult with staff before acting on these thoughts. Will continue POC.

## 2017-02-06 NOTE — [facility] Group Notes (Signed)
[facility] Therapy 02/06/2017 1:15pm  Type of Therapy: Mental Health Association Presentation  Participation Level: Active  Participation Quality: Attentive  Affect: Appropriate  Cognitive: Oriented  Insight: Developing/Improving  Engagement in Therapy: Engaged  Modes of Intervention: Discussion, Education and Socialization  Summary of Progress/Problems: Mental Health Association (MHA) Speaker came to talk about his personal journey with mental health. The pt processed ways by which to relate to the speaker. MHA speaker provided handouts and educational information pertaining to groups and services offered by the [facility]. Pt was engaged in speaker's presentation and was receptive to resources provided.    Pulte HomesHeather N Smart, LCSW 02/06/2017 4:13 PM

## 2017-02-06 NOTE — Progress Notes (Signed)
Adult Psychoeducational Group Note  Date:  02/06/2017 Time:  5:17 AM  Group Topic/Focus:  Wrap-Up Group:   The focus of this group is to help patients review their daily goal of treatment and discuss progress on daily workbooks.  Participation Level:  Active  Participation Quality:  Appropriate  Affect:  Appropriate  Cognitive:  Appropriate  Insight: Appropriate and Good  Engagement in Group:  Engaged  Modes of Intervention:  Discussion  Additional Comments:  Pt stated his goal for today was to interact more with his peers. Pt stated he accomplished his goal today. Pt rated his overall day a 10. Pt stated his goal for tomorrow was to talk with his doctor about his medication.  Felipa FurnaceChristopher  Navdeep Fessenden 02/06/2017, 5:17 AM

## 2017-02-06 NOTE — Tx Team (Signed)
Interdisciplinary Treatment and Diagnostic Plan Update  02/06/2017 Time of Session: 0830AM Edi Gorniak MRN: [id number]  Principal Diagnosis: MDD (major depressive disorder)  Secondary Diagnoses: Principal Problem:   MDD (major depressive disorder) Active Problems:   Alcohol use disorder, severe, dependence ([place])   Current Medications:  Current Facility-Administered Medications  Medication Dose Route Frequency Provider Last Rate Last Dose  . acetaminophen (TYLENOL) tablet 650 mg  650 mg Oral Q6H PRN Derrill Center, NP   650 mg at 02/05/17 0831  . albuterol (PROVENTIL HFA;VENTOLIN HFA) 108 (90 Base) MCG/ACT inhaler 1-2 puff  1-2 puff Inhalation Q6H PRN Pennelope Bracken, MD   2 puff at 02/05/17 2243  . alum & mag hydroxide-simeth (MAALOX/MYLANTA) 200-200-20 MG/5ML suspension 30 mL  30 mL Oral Q4H PRN Derrill Center, NP   30 mL at 02/05/17 2108  . FLUoxetine (PROZAC) capsule 20 mg  20 mg Oral Daily Nwoko, Herbert Pun I, NP   20 mg at 02/06/17 0845  . hydrOXYzine (ATARAX/VISTARIL) tablet 25 mg  25 mg Oral Q6H PRN Lindon Romp A, NP   25 mg at 02/05/17 2106  . loperamide (IMODIUM) capsule 2-4 mg  2-4 mg Oral PRN Lindon Romp A, NP   4 mg at 02/04/17 2130  . LORazepam (ATIVAN) tablet 1 mg  1 mg Oral Q6H PRN Lindon Romp A, NP      . LORazepam (ATIVAN) tablet 1 mg  1 mg Oral TID Lindon Romp A, NP   1 mg at 02/06/17 0845   Followed by  . LORazepam (ATIVAN) tablet 1 mg  1 mg Oral BID Rozetta Nunnery, NP       Followed by  . [START ON 02/08/2017] LORazepam (ATIVAN) tablet 1 mg  1 mg Oral Daily Lindon Romp A, NP      . magnesium hydroxide (MILK OF MAGNESIA) suspension 30 mL  30 mL Oral Daily PRN Derrill Center, NP      . multivitamin with minerals tablet 1 tablet  1 tablet Oral Daily Lindon Romp A, NP   1 tablet at 02/06/17 0845  . nicotine (NICODERM CQ - dosed in mg/24 hours) patch 21 mg  21 mg Transdermal Daily Cobos, Myer Peer, MD   21 mg at 02/06/17 0845  . ondansetron (ZOFRAN-ODT)  disintegrating tablet 4 mg  4 mg Oral Q6H PRN Lindon Romp A, NP      . thiamine (VITAMIN B-1) tablet 100 mg  100 mg Oral Daily Lindon Romp A, NP   100 mg at 02/06/17 0845  . traZODone (DESYREL) tablet 50 mg  50 mg Oral QHS PRN Derrill Center, NP   50 mg at 02/05/17 2106   PTA Medications: Medications Prior to Admission  Medication Sig Dispense Refill Last Dose  . alum & mag hydroxide-simeth (MAALOX/MYLANTA) 200-200-20 MG/5ML suspension Take 30 mLs by mouth every 6 (six) hours as needed for indigestion or heartburn. (Patient not taking: Reported on 02/03/2017) 355 mL 0 Completed Course at Unknown time  . ondansetron (ZOFRAN) 4 MG tablet Take 1 tablet (4 mg total) by mouth every 8 (eight) hours as needed for nausea or vomiting. (Patient not taking: Reported on 02/03/2017) 11 tablet 0 Completed Course at Unknown time    Patient Stressors: Financial difficulties Health problems Substance abuse  Patient Strengths: Capable of independent living [employer]ator fund of knowledge Work skills  Treatment Modalities: Medication Management, Group therapy, Case management,  1 to 1 session with clinician, Psychoeducation, Recreational therapy.   Physician Treatment  Plan for Primary Diagnosis: MDD (major depressive disorder) Long Term Goal(s): Improvement in symptoms so as ready for discharge Improvement in symptoms so as ready for discharge   Short Term Goals: Ability to identify changes in lifestyle to reduce recurrence of condition will improve Ability to disclose and discuss suicidal ideas Ability to demonstrate self-control will improve Ability to identify and develop effective coping behaviors will improve Compliance with prescribed medications will improve Ability to identify triggers associated with substance abuse/mental health issues will improve  Medication Management: Evaluate patient's response, side effects, and tolerance of medication regimen.  Therapeutic  Interventions: 1 to 1 sessions, Unit Group sessions and Medication administration.  Evaluation of Outcomes: Not Met  Physician Treatment Plan for Secondary Diagnosis: Principal Problem:   MDD (major depressive disorder) Active Problems:   Alcohol use disorder, severe, dependence ([place])  Long Term Goal(s): Improvement in symptoms so as ready for discharge Improvement in symptoms so as ready for discharge   Short Term Goals: Ability to identify changes in lifestyle to reduce recurrence of condition will improve Ability to disclose and discuss suicidal ideas Ability to demonstrate self-control will improve Ability to identify and develop effective coping behaviors will improve Compliance with prescribed medications will improve Ability to identify triggers associated with substance abuse/mental health issues will improve     Medication Management: Evaluate patient's response, side effects, and tolerance of medication regimen.  Therapeutic Interventions: 1 to 1 sessions, Unit Group sessions and Medication administration.  Evaluation of Outcomes: Not Met   RN Treatment Plan for Primary Diagnosis: MDD (major depressive disorder) Long Term Goal(s): Knowledge of disease and therapeutic regimen to maintain health will improve  Short Term Goals: Ability to remain free from injury will improve, Ability to disclose and discuss suicidal ideas and Ability to identify and develop effective coping behaviors will improve  Medication Management: RN will administer medications as ordered by provider, will assess and evaluate patient's response and provide education to patient for prescribed medication. RN will report any adverse and/or side effects to prescribing provider.  Therapeutic Interventions: 1 on 1 counseling sessions, Psychoeducation, Medication administration, Evaluate responses to treatment, Monitor vital signs and CBGs as ordered, Perform/monitor CIWA, COWS, AIMS and Fall Risk screenings as  ordered, Perform wound care treatments as ordered.  Evaluation of Outcomes: Progressing   LCSW Treatment Plan for Primary Diagnosis: MDD (major depressive disorder) Long Term Goal(s): Safe transition to appropriate next level of care at discharge, Engage patient in therapeutic group addressing interpersonal concerns.  Short Term Goals: Engage patient in aftercare planning with referrals and resources, Facilitate patient progression through stages of change regarding substance use diagnoses and concerns and Identify triggers associated with mental health/substance abuse issues  Therapeutic Interventions: Assess for all discharge needs, 1 to 1 time with Social worker, Explore available resources and support systems, Assess for adequacy in community support network, Educate family and significant other(s) on suicide prevention, Complete Psychosocial Assessment, Interpersonal group therapy.  Evaluation of Outcomes: Progressing   Progress in Treatment: Attending groups: Yes. Participating in groups: Yes. Taking medication as prescribed: Yes. Toleration medication: Yes. Family/Significant other contact made: SPE completed with pt; pt declined to consent to family contact.  Patient understands diagnosis: Yes. Discussing patient identified problems/goals with staff: Yes. Medical problems stabilized or resolved: Yes. Denies suicidal/homicidal ideation: Yes. Issues/concerns per patient self-inventory: No. Other: n/a   New problem(s) identified: No, Describe:  n/a  New Short Term/Long Term Goal(s): detox, medication management for mood stabilization, development of comprehensive mental wellness/sobriety plan.  Discharge Plan or Barriers: CSW assessing for appropriate referrals. Pt referred to [facility] and plans to attend [facility] for outpatient mental health care. [place] pamphlet and NA/AA list provided for [facility] for additional referrals.   Reason for Continuation of  Hospitalization: Anxiety Depression Medication stabilization Withdrawal symptoms  Estimated Length of Stay: Friday, 02/08/17  Attendees: Patient: 02/06/2017 9:16 AM  Physician: Dr. Sanjuana Letters MD; Dr. Nancy Fetter MD 02/06/2017 9:16 AM  Nursing: Trinna Post RN; Opal Sidles RN 02/06/2017 9:16 AM  RN Care Manager: Lars Pinks CM 02/06/2017 9:16 AM  Social Worker:  Maxie Better, LCSW 02/06/2017 9:16 AM  Recreational Therapist: x 02/06/2017 9:16 AM  Other: Lindell Spar NP; Darnelle Maffucci Money NP 02/06/2017 9:16 AM  Other:  02/06/2017 9:16 AM  Other: 02/06/2017 9:16 AM    Scribe for Treatment Team: Pross[place], LCSW 02/06/2017 9:16 AM

## 2017-02-06 NOTE — Progress Notes (Signed)
Recreation Therapy Notes  Date: 02/06/17 Time: 0930 Location: [address] Dayroom  Group Topic: Stress Management  Goal Area(s) Addresses:  Patient will verbalize importance of using healthy stress management.  Patient will identify positive emotions associated with healthy stress management.   Intervention: Stress Management  Activity :  Guided Imagery.  LRT introduced the stress management technique of guided imagery.  LRT read Meyer script to allow patients to take Meyer mental vacation to escape their daily routine for Meyer few minutes.  Education: Stress Management, Discharge Planning.   Education Outcome: Acknowledges edcuation/In group clarification offered/Needs additional education  Clinical Observations/Feedback: Pt did not attend group.    Cody Meyer, LRT/CTRS          Cody Meyer 02/06/2017 12:02 PM 

## 2017-02-06 NOTE — Progress Notes (Signed)
Nursing Progress Note [id number]  D) Patient presents calm and cooperative. Patient reports he is not feeling well and complains of a sore throat. Patient is observed up in the dayroom this evening and attended group. Patient provided snacks this evening. Patient requested use of his inhaler and tylenol for general malaise. Patient denies SI/HI/AVH. Patient contracts for safety on the unit. Patient compliant with sleep medications. Patient reports his head "feels stuffy". Temperature obtained this evening and was 98.7 F.   A) Emotional support given. 1:1 interaction and active listening provided. Patient medicated as prescribed. Medications and plan of care reviewed with patient. Patient verbalized understanding without further questions. Snacks and fluids provided. Opportunities for questions or concerns presented to patient. Patient encouraged to continue to work on treatment goals. Labs, vital signs and patient behavior monitored throughout shift. Patient safety maintained with q15 min safety checks. Low fall risk precautions in place and reviewed with patient; patient verbalized understanding.  R) Patient receptive to interaction with nurse. Patient remains safe on the unit at this time. Patient denies any adverse medication reactions at this time. Patient is resting in bed without complaints. Will continue to monitor.
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
## 2017-02-07 LAB — RAPID STREP SCREEN (MED CTR MEBANE ONLY): STREPTOCOCCUS, GROUP A SCREEN (DIRECT): NEGATIVE

## 2017-02-07 MED ORDER — NITROGLYCERIN 0.4 MG SL SUBL: 0.4000 mg | SUBLINGUAL_TABLET | SUBLINGUAL | Status: DC | PRN

## 2017-02-07 MED ORDER — RISPERIDONE 1 MG PO TABS
1.0000 mg | ORAL_TABLET | Freq: Every day | ORAL | Status: DC
  Administered 2017-02-07 – 2017-02-13 (×7): 1 mg via ORAL
  Filled 2017-02-07 (×10): qty 1

## 2017-02-07 MED ORDER — PHENOL 1.4 % MT LIQD
1.0000 | OROMUCOSAL | Status: DC | PRN
  Administered 2017-02-07 – 2017-02-09 (×9): 1 via OROMUCOSAL
  Filled 2017-02-07: qty 177
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
## 2017-02-07 NOTE — Progress Notes (Signed)
D:Pt has been in bed this morning and was reluctant to get out of bed to take his morning scheduled medications. Pt c/o a sore throat and was given prn tylenol. Reported sore throat to MD and NP for evaluation. Pt has mild detox symptoms this morning. A:Offered support, encouragement and 15 minute checks. R:Pt denies si and hi. Safety maintained on the unit.

## 2017-02-07 NOTE — Progress Notes (Signed)
Nursing Progress Note 1900-0730  D) Patient presents with depressed mood but brightens upon interaction with peers/staff. Patient did attended group and was observed up playing spades with peers in the dayroom. Patient continues to report a sore throat and requests use of his medicated spray. Patient compliant with scheduled medications. Patient denies chest pain at this time. Patient provided first dose of Risperdal and verbalized understanding of medication. Patient denies SI/HI/AVH at time of assessment. Patient contracts for safety on the unit. Patient denies withdrawal symptoms at this time; CIWA score 2 for mild anxiety.  A) Emotional support given. 1:1 interaction and active listening provided. Patient medicated as prescribed. Medications and plan of care reviewed with patient. Patient verbalized understanding without further questions. Snacks and fluids provided. Opportunities for questions or concerns presented to patient. Patient encouraged to continue to work on treatment goals. Labs, vital signs and patient behavior monitored throughout shift. Patient safety maintained with q15 min safety checks. Low fall risk precautions in place and reviewed with patient; patient verbalized understanding.  R) Patient receptive to interaction with nurse. Patient remains safe on the unit at this time. Patient denies any adverse medication reactions at this time. Patient is resting in bed without complaints. Will continue to monitor.

## 2017-02-07 NOTE — Progress Notes (Signed)
Pt c/o of chest pain and when asked to describe pain, pt reported that he has a lump on his chest. Writer observed chest and no lump observed visually or palpated. Reported to NP. Pt c/o sore throat and was given prn throat spray following strep test. Pt verbally denied si and hi thoughts this morning. Per self inventory, pt has passive si thoughts. Safety maintained on the unit.

## 2017-02-07 NOTE — [facility] Group Notes (Signed)
LCSW Group Therapy Note   02/07/2017 1:15pm   Type of Therapy and Topic:  Group Therapy:  Overcoming Obstacles   Participation Level:  Minimal   Description of Group:    In this group patients will be encouraged to explore what they see as obstacles to their own wellness and recovery. They will be guided to discuss their thoughts, feelings, and behaviors related to these obstacles. The group will process together ways to cope with barriers, with attention given to specific choices patients can make. Each patient will be challenged to identify changes they are motivated to make in order to overcome their obstacles. This group will be process-oriented, with patients participating in exploration of their own experiences as well as giving and receiving support and challenge from other group members.   Therapeutic Goals: 1. Patient will identify personal and current obstacles as they relate to admission. 2. Patient will identify barriers that currently interfere with their wellness or overcoming obstacles.  3. Patient will identify feelings, thought process and behaviors related to these barriers. 4. Patient will identify two changes they are willing to make to overcome these obstacles:      Summary of Patient Progress   Cody Meyer was attentive during group. He shared that [facility] but understands that he may have to take care of court obligations first. He continues to present with flat affect and blunted mood. Cody Meyer demonstrates improving insight and some progress in the group setting.    Therapeutic Modalities:   Cognitive Behavioral Therapy Solution Focused Therapy Motivational Interviewing Relapse Prevention Therapy  Ledell PeoplesHeather N Smart, LCSW 02/07/2017 12:16 PM

## 2017-02-07 NOTE — Progress Notes (Signed)
Pt has been flat/sad this evening with si thoughts. Pt would not discuss what type of plan he had and made reference to planning something when he leaves the hospital. Pt reluctantly contracted for safety. Pt reports that his father died three months ago and pt described his father as mean. He said that his mom passed away when he was 89six years old and his grandmother when he was [age]. Pt says that he has no friends or family support. He reports that [place]." He says that he had a job and was homeless spending his money on drugs.

## 2017-02-07 NOTE — Plan of Care (Signed)
  Safety: Goal: Periods of time without injury will increase Interventions: Patient is on q15 minute safety checks and low fall risk precautions. Patient contracts for safety on the unit Outcome: Patient remains safe at this time 02/07/2017 2244 - Progressing by Ferrel Loganollazo, Glendel Jaggers A, RN

## 2017-02-08 DIAGNOSIS — F333 Major depressive disorder, recurrent, severe with psychotic symptoms: Secondary | ICD-10-CM

## 2017-02-08 MED ORDER — LORATADINE 10 MG PO TABS
10.0000 mg | ORAL_TABLET | Freq: Every day | ORAL | Status: DC
  Administered 2017-02-09 – 2017-02-13 (×5): 10 mg via ORAL
  Filled 2017-02-08 (×10): qty 1

## 2017-02-08 NOTE — Progress Notes (Signed)
Recreation Therapy Notes  Date: 02/08/17 Time: 0930 Location: 300 Hall Group Room  Group Topic: Stress Management  Goal Area(s) Addresses:  Patient will verbalize importance of using healthy stress management.  Patient will identify positive emotions associated with healthy stress management.   Intervention: Stress Management  Activity :  Progressive Muscle Relaxation.  LRT introduced the stress management technique of progressive muscle relaxation.  LRT lead the group the exercise of tensing and relaxing each muscle group individually.  Education:  Stress Management, Discharge Planning.   Education Outcome: Acknowledges edcuation/In group clarification offered/Needs additional education  Clinical Observations/Feedback: Pt did not attend group.   Keriana Sarsfield, LRT/CTRS         Jurline Folger A 02/08/2017 11:48 AM 

## 2017-02-08 NOTE — Plan of Care (Signed)
Safety Care Plan Documentation  Goal: Periods of time without injury will increase Intervention: Patient is on q15 minute safety checks and low fall risk precautions. Patient verbally contracts for safety on the unit. Outcome: Patient remains safe at this time  02/08/2017 2353 - Progressing by Ferrel Loganollazo, Rabecka Brendel A, RN

## 2017-02-08 NOTE — Progress Notes (Signed)
D Pt is OOB AUL ont eh [facility] today..he toelrates this well. HE is not forthcoming with his issues and /or his feelings. HE sits quietly in the dayroom. A HE completed hisd aily assessment and on this he wrote he has experienced SI today but he contracts with this writer to not hurt himslef today. He rates his depression, hopelessness and anxeity " 7/6/6/", respectively. He uses chloroseptic throat spary for c/o sore throat. R Safety is in place. He deneis withdrawal symptoms.

## 2017-02-08 NOTE — Progress Notes (Signed)
[facility] referral faxed. Pt has been accepted for screening and [facility]. However, he does not have anywhere safe to stay until screening. [facility] may have bed available sooner. Pt placed on waitlist.  Trula SladeHeather Smart, MSW, LCSW Clinical Social Worker 02/08/2017 12:30 PM

## 2017-02-08 NOTE — Progress Notes (Signed)
Nursing Progress Note 1900-0730  D) Patient presents with improved brightened affect and pleasant mood. Patient reports anxiety and depression. Patient is observed up in the dayroom interactive with peers. Patient denies SI/HI/AVH or pain. Patient contracts for safety on the unit. Patient reports sleeping well with current regimen. Patient compliant with medications and provided PRNs as ordered. Patient is free of withdrawal symptoms tonight.  A) Emotional support given. 1:1 interaction and active listening provided. Patient medicated as prescribed. Medications and plan of care reviewed with patient. Patient verbalized understanding without further questions. Snacks and fluids provided. Opportunities for questions or concerns presented to patient. Patient encouraged to continue to work on treatment goals. Labs, vital signs and patient behavior monitored throughout shift. Patient safety maintained with q15 min safety checks. Low fall risk precautions in place and reviewed with patient; patient verbalized understanding.  R) Patient receptive to interaction with nurse. Patient remains safe on the unit at this time. Patient denies any adverse medication reactions at this time. Patient is resting in bed without complaints. Will continue to monitor.
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
## 2017-02-08 NOTE — [facility] Group Notes (Signed)
LCSW Group Therapy Note  02/08/2017 1:15pm  Type of Therapy and Topic:  Group Therapy:  Feelings around Relapse and Recovery  Participation Level:  Active   Description of Group:    Patients in this group will discuss emotions they experience before and after a relapse. They will process how experiencing these feelings, or avoidance of experiencing them, relates to having a relapse. Facilitator will guide patients to explore emotions they have related to recovery. Patients will be encouraged to process which emotions are more powerful. They will be guided to discuss the emotional reaction significant others in their lives may have to their relapse or recovery. Patients will be assisted in exploring ways to respond to the emotions of others without this contributing to a relapse.  Therapeutic Goals: 1. Patient will identify two or more emotions that lead to a relapse for them 2. Patient will identify two emotions that result when they relapse 3. Patient will identify two emotions related to recovery 4. Patient will demonstrate ability to communicate their needs through discussion and/or role plays   Summary of Patient Progress:  Cody Meyer was attentive and engaged during today's processing group. He was receptive to information provided about PAWS. Cody Meyer states that he never tried to remain sober for more than a few days but thinks that he is already experiencing cravings and psychological post acute symptoms. He was interested in completing his safety plan. Pt continues to demonstrate improving insight and progress in the group setting.    Therapeutic Modalities:   Cognitive Behavioral Therapy Solution-Focused Therapy Assertiveness Training Relapse Prevention Therapy   Ledell PeoplesHeather N Smart, LCSW 02/08/2017 2:01 PM

## 2017-02-09 MED ORDER — PREDNISONE 10 MG (21) PO TBPK
20.0000 mg | ORAL_TABLET | Freq: Every evening | ORAL | Status: AC
  Administered 2017-02-10: 20 mg via ORAL

## 2017-02-09 MED ORDER — PREDNISONE 10 MG (21) PO TBPK
10.0000 mg | ORAL_TABLET | Freq: Four times a day (QID) | ORAL | Status: DC
  Administered 2017-02-11 – 2017-02-13 (×9): 10 mg via ORAL

## 2017-02-09 MED ORDER — PREDNISONE 10 MG (21) PO TBPK
10.0000 mg | ORAL_TABLET | Freq: Three times a day (TID) | ORAL | Status: AC
  Administered 2017-02-10 (×3): 10 mg via ORAL

## 2017-02-09 MED ORDER — PREDNISONE 10 MG (21) PO TBPK
20.0000 mg | ORAL_TABLET | Freq: Every morning | ORAL | Status: AC
  Administered 2017-02-10: 20 mg via ORAL
  Filled 2017-02-09: qty 21

## 2017-02-09 MED ORDER — PREDNISONE 10 MG (21) PO TBPK
10.0000 mg | ORAL_TABLET | ORAL | Status: AC
  Administered 2017-02-10: 10 mg via ORAL

## 2017-02-09 MED ORDER — TRAZODONE HCL 50 MG PO TABS
ORAL_TABLET | ORAL | Status: AC
  Filled 2017-02-09: qty 1

## 2017-02-09 MED ORDER — PREDNISONE 10 MG (21) PO TBPK
10.0000 mg | ORAL_TABLET | ORAL | Status: AC
  Administered 2017-02-10: 10 mg via ORAL
  Filled 2017-02-09: qty 21

## 2017-02-09 MED ORDER — TRAZODONE HCL 150 MG PO TABS
150.0000 mg | ORAL_TABLET | Freq: Every day | ORAL | Status: DC
  Administered 2017-02-09 – 2017-02-13 (×5): 150 mg via ORAL
  Filled 2017-02-09 (×7): qty 1
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
## 2017-02-09 NOTE — Progress Notes (Signed)
Patient ID: Cody Meyer, male   DOB: 12-30-1980, 36 y.o.   MRN: [id number]  Pt currently presents with a flat affect and cooperative  behavior. Pt reports to Clinical research associatewriter that is current concern is "all this pain in my throat." Reports pain with swallowing and pain from "swollen lymph node" in his L hip. Reports that he was taking a "steriod" when he lived in [place] for his Sarcoidosis. He concerns that throat pain is related to medical diagnosis. Pt states "It's just worrying me." Pt reports good sleep with current medication regimen.   Pt provided with medications per providers orders. Pt's labs and vitals were monitored throughout the night. Pt given a 1:1 about emotional and mental status. Pt supported and encouraged to express concerns and questions. Pt educated on medications. Provider consulted about patients concerns. Allyson SabalBerry, NP places orders for a Prednisone taper. Verbal orders for patient to follow up with [facility] for medication management. Will consider ordering an imaging appointment.   Pt's safety ensured with 15 minute and environmental checks. Pt currently denies SI/HI and A/V hallucinations. Pt verbally agrees to seek staff if SI/HI or A/VH occurs and to consult with staff before acting on any harmful thoughts. Pt verbalizes understanding of Providers orders. Will continue POC.
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
## 2017-02-09 NOTE — [facility] Group Notes (Signed)
[facility] LCSW Group Therapy Note  02/09/2017  @ 10:15 AM  Type of Therapy and Topic:  Group Therapy: Avoiding Self-Sabotaging and Enabling Behaviors  Participation Level:  Active   Description of Group The main focus of today's process group to discuss what "self-sabotage" means and use motivational iInterviewing to discuss what benefits, negative or positive, were involved in a self-identified self-sabotaging behavior. We then talked about reasons the patient may want to change the behavior and their current desire to change.   Summary of Patient Progress: Patient identified with patterns of behaviors he engages in that are not in his own best interests such as self medication and negative peer group. Patient is invested in self care as evidenced by his commitment to obtain further inpatient treatment, attend AA support groups, follow up on neglected needs such as dentistry and medical check ups. Patient reports motivation will be to find joy in his days.    Therapeutic molalities: Cognitive Behavioral Therapy Person-Centered Therapy Motivational Interviewing  Therapeutic Goals: 1. Patients will demonstrate understanding of the concept of self sabotage 2. Patients will be able to identify pros and cons of their behaviors 3. Patients will be able to identify at least two motivating factors for l of their desire for change   Carney Bernatherine C Harrill, LCSW

## 2017-02-09 NOTE — Progress Notes (Addendum)
D Pt is seen at the med window first thing this morning, as he takes his daily meds. HE makes good eye contact. A HE completes his daily assessment and on this he wrote he denied having SI within the past 24 hours and he rated his feelings of depression, hopelessness and anxiety " 7/6/5", respectively. HE is seen sitting quietly in the dayroom, but he does not socialize with other patients. HE gives quick, one-word answers to this writer's assessment questions. R Safety is in place.

## 2017-02-10 LAB — CULTURE, GROUP A STREP (THRC)
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
## 2017-02-10 NOTE — Progress Notes (Signed)
Patient did attend the evening speaker AA meeting.  

## 2017-02-10 NOTE — Progress Notes (Addendum)
Patient ID: Charlann Langeerlie Spangler, male   DOB: September 20, 1980, 36 y.o.   MRN: [id number]  Pt currently presents with a blunted affect and cooperative behavior. Pt behavior is attention seeking, Technical sales engineeroverheard writer asking another patient if they would like a drink to which he states, "hmmhmm, I am parched, really thirsty." Pt reports to writer that [facility] Pt reports good sleep with current medication regimen. Reports that his throat and hip do not hurt as much today after starting the Prednisone.  Pt provided with medications per providers orders. Pt's labs and vitals were monitored throughout the night. Pt given a 1:1 about emotional and mental status. Pt supported and encouraged to express concerns and questions. Pt educated on medications.  Pt's safety ensured with 15 minute and environmental checks. Pt currently denies SI/HI and A/V hallucinations. Pt verbally agrees to seek staff if SI/HI or A/VH occurs and to consult with staff before acting on any harmful thoughts. Will continue POC.

## 2017-02-10 NOTE — [facility] Group Notes (Signed)
[facility] LCSW Group Therapy Note    02/10/2017 10:15 AM   Type of Therapy and Topic: Group Therapy: Feelings Around Returning Home & Establishing a Supportive Framework    Participation Level: Did not attend; invited to participate yet did not despite overhead announcement and encouragement by staff   Catherine C Harrill, LCSW 

## 2017-02-11 DIAGNOSIS — D869 Sarcoidosis, unspecified: Secondary | ICD-10-CM

## 2017-02-11 MED ORDER — LOPERAMIDE HCL 2 MG PO CAPS
2.0000 mg | ORAL_CAPSULE | ORAL | Status: DC | PRN
  Administered 2017-02-11 – 2017-02-13 (×3): 2 mg via ORAL
  Filled 2017-02-11 (×4): qty 1

## 2017-02-11 NOTE — [facility] Group Notes (Signed)
LCSW Group Therapy Note   02/11/2017 1:15pm   Type of Therapy and Topic:  Group Therapy:  Overcoming Obstacles   Participation Level:  Did Not Attend-pt invited. Chose to remain in bed.    Description of Group:    In this group patients will be encouraged to explore what they see as obstacles to their own wellness and recovery. They will be guided to discuss their thoughts, feelings, and behaviors related to these obstacles. The group will process together ways to cope with barriers, with attention given to specific choices patients can make. Each patient will be challenged to identify changes they are motivated to make in order to overcome their obstacles. This group will be process-oriented, with patients participating in exploration of their own experiences as well as giving and receiving support and challenge from other group members.   Therapeutic Goals: 1. Patient will identify personal and current obstacles as they relate to admission. 2. Patient will identify barriers that currently interfere with their wellness or overcoming obstacles.  3. Patient will identify feelings, thought process and behaviors related to these barriers. 4. Patient will identify two changes they are willing to make to overcome these obstacles:      Summary of Patient Progress      Therapeutic Modalities:   Cognitive Behavioral Therapy Solution Focused Therapy Motivational Interviewing Relapse Prevention Therapy  Ledell PeoplesHeather N Smart, LCSW 02/11/2017 12:22 PM

## 2017-02-11 NOTE — Progress Notes (Signed)
Adult Psychoeducational Group Note  Date:  02/11/2017 Time:  8:55 PM  Group Topic/Focus:  Wrap-Up Group:   The focus of this group is to help patients review their daily goal of treatment and discuss progress on daily workbooks.  Participation Level:  Active  Participation Quality:  Appropriate  Affect:  Appropriate  Cognitive:  Appropriate  Insight: Appropriate  Engagement in Group:  Engaged  Modes of Intervention:  Discussion  Additional Comments:  The patient attended wrap-up group.The patient also said that he rates today 7.  Octavio Mannshigpen, Masaki Rothbauer Lee 02/11/2017, 8:55 PM
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
## 2017-02-11 NOTE — Progress Notes (Signed)
Recreation Therapy Notes  Date: 02/11/17 Time: 0930 Location: [address] Dayroom  Group Topic: Stress Management  Goal Area(s) Addresses:  Patient will verbalize importance of using healthy stress management.  Patient will identify positive emotions associated with healthy stress management.   Intervention: Stress Management  Activity :  Meditation.  LRT introduced the stress management technique of meditation.  LRT played a meditation from the Calm app that allowed patients to focus on the things they are grateful for.  Education:  Stress Management, Discharge Planning.   Education Outcome: Acknowledges edcuation/In group clarification offered/Needs additional education  Clinical Observations/Feedback: Pt did not attend group.    Caroll RancherMarjette Keyshla Tunison, LRT/CTRS      Caroll RancherLindsay, Rendell Thivierge A 02/11/2017 12:13 PM

## 2017-02-11 NOTE — Progress Notes (Signed)
DAR NOTE: Pt present with bright affect and calm mood in the unit. Pt has been visible in the dayroom, complained stomatch upset, maalox given with a little bit of relief, due to this , pt refused dinner . Pt denies physical pain, took all his meds as scheduled. As per self inventory, pt had a fair night sleep, good appetite, normal energy, and good concentration. Pt rate depression at 5, hopeless ness at 5, and anxiety at 4. Pt's safety ensured with 15 minute and environmental checks. Pt currently denies SI/HI and A/V hallucinations. Pt verbally agrees to seek staff if SI/HI or A/VH occurs and to consult with staff before acting on these thoughts. Will continue POC.

## 2017-02-11 NOTE — Tx Team (Signed)
Interdisciplinary Treatment and Diagnostic Plan Update  02/11/2017 Time of Session: 0830AM Cody Meyer MRN: [id number]  Principal Diagnosis: MDD (major depressive disorder)  Secondary Diagnoses: Principal Problem:   MDD (major depressive disorder) Active Problems:   Alcohol use disorder, severe, dependence (HCC)   Current Medications:  Current Facility-Administered Medications  Medication Dose Route Frequency Provider Last Rate Last Dose  . acetaminophen (TYLENOL) tablet 650 mg  650 mg Oral Q6H PRN Oneta RackLewis, Tanika N, NP   650 mg at 02/08/17 2153  . albuterol (PROVENTIL HFA;VENTOLIN HFA) 108 (90 Base) MCG/ACT inhaler 1-2 puff  1-2 puff Inhalation Q6H PRN Micheal Likensainville, Christopher T, MD   2 puff at 02/10/17 2210  . alum & mag hydroxide-simeth (MAALOX/MYLANTA) 200-200-20 MG/5ML suspension 30 mL  30 mL Oral Q4H PRN Oneta RackLewis, Tanika N, NP   30 mL at 02/10/17 1943  . FLUoxetine (PROZAC) capsule 20 mg  20 mg Oral Daily Armandina StammerNwoko, Agnes I, NP   20 mg at 02/11/17 0824  . loratadine (CLARITIN) tablet 10 mg  10 mg Oral Daily Nira ConnBerry, Jason A, NP   10 mg at 02/11/17 0824  . magnesium hydroxide (MILK OF MAGNESIA) suspension 30 mL  30 mL Oral Daily PRN Oneta RackLewis, Tanika N, NP      . multivitamin with minerals tablet 1 tablet  1 tablet Oral Daily Nira ConnBerry, Jason A, NP   1 tablet at 02/11/17 0824  . nicotine (NICODERM CQ - dosed in mg/24 hours) patch 21 mg  21 mg Transdermal Daily Cobos, Fernando A, MD   21 mg at 02/11/17 0825  . nitroGLYCERIN (NITROSTAT) SL tablet 0.4 mg  0.4 mg Sublingual Q5 min PRN Nwoko, Agnes I, NP      . phenol (CHLORASEPTIC) mouth spray 1 spray  1 spray Mouth/Throat PRN Armandina StammerNwoko, Agnes I, NP   1 spray at 02/09/17 2037  . predniSONE (STERAPRED UNI-PAK 21 TAB) tablet 10 mg  10 mg Oral 4X daily taper Nira ConnBerry, Jason A, NP   10 mg at 02/11/17 0824  . risperiDONE (RISPERDAL) tablet 1 mg  1 mg Oral QHS Nwoko, Agnes I, NP   1 mg at 02/10/17 2208  . thiamine (VITAMIN B-1) tablet 100 mg  100 mg Oral Daily Nira ConnBerry,  Jason A, NP   100 mg at 02/11/17 0824  . traZODone (DESYREL) tablet 150 mg  150 mg Oral QHS Armandina StammerNwoko, Agnes I, NP   150 mg at 02/10/17 2208   PTA Medications: Medications Prior to Admission  Medication Sig Dispense Refill Last Dose  . alum & mag hydroxide-simeth (MAALOX/MYLANTA) 200-200-20 MG/5ML suspension Take 30 mLs by mouth every 6 (six) hours as needed for indigestion or heartburn. (Patient not taking: Reported on 02/03/2017) 355 mL 0 Completed Course at Unknown time  . ondansetron (ZOFRAN) 4 MG tablet Take 1 tablet (4 mg total) by mouth every 8 (eight) hours as needed for nausea or vomiting. (Patient not taking: Reported on 02/03/2017) 11 tablet 0 Completed Course at Unknown time    Patient Stressors: Financial difficulties Health problems Substance abuse  Patient Strengths: Capable of independent living Wellsite geologistCommunication skills General fund of knowledge Work skills  Treatment Modalities: Medication Management, Group therapy, Case management,  1 to 1 session with clinician, Psychoeducation, Recreational therapy.   Physician Treatment Plan for Primary Diagnosis: MDD (major depressive disorder) Long Term Goal(s): Improvement in symptoms so as ready for discharge Improvement in symptoms so as ready for discharge   Short Term Goals: Ability to identify changes in lifestyle to reduce recurrence of condition will  improve Ability to disclose and discuss suicidal ideas Ability to demonstrate self-control will improve Ability to identify and develop effective coping behaviors will improve Compliance with prescribed medications will improve Ability to identify triggers associated with substance abuse/mental health issues will improve  Medication Management: Evaluate patient's response, side effects, and tolerance of medication regimen.  Therapeutic Interventions: 1 to 1 sessions, Unit Group sessions and Medication administration.  Evaluation of Outcomes: Progressing   Physician Treatment  Plan for Secondary Diagnosis: Principal Problem:   MDD (major depressive disorder) Active Problems:   Alcohol use disorder, severe, dependence (HCC)  Long Term Goal(s): Improvement in symptoms so as ready for discharge Improvement in symptoms so as ready for discharge   Short Term Goals: Ability to identify changes in lifestyle to reduce recurrence of condition will improve Ability to disclose and discuss suicidal ideas Ability to demonstrate self-control will improve Ability to identify and develop effective coping behaviors will improve Compliance with prescribed medications will improve Ability to identify triggers associated with substance abuse/mental health issues will improve     Medication Management: Evaluate patient's response, side effects, and tolerance of medication regimen.  Therapeutic Interventions: 1 to 1 sessions, Unit Group sessions and Medication administration.  Evaluation of Outcomes: Progressing   RN Treatment Plan for Primary Diagnosis: MDD (major depressive disorder) Long Term Goal(s): Knowledge of disease and therapeutic regimen to maintain health will improve  Short Term Goals: Ability to remain free from injury will improve, Ability to disclose and discuss suicidal ideas and Ability to identify and develop effective coping behaviors will improve  Medication Management: RN will administer medications as ordered by provider, will assess and evaluate patient's response and provide education to patient for prescribed medication. RN will report any adverse and/or side effects to prescribing provider.  Therapeutic Interventions: 1 on 1 counseling sessions, Psychoeducation, Medication administration, Evaluate responses to treatment, Monitor vital signs and CBGs as ordered, Perform/monitor CIWA, COWS, AIMS and Fall Risk screenings as ordered, Perform wound care treatments as ordered.  Evaluation of Outcomes: Progressing   LCSW Treatment Plan for Primary Diagnosis:  MDD (major depressive disorder) Long Term Goal(s): Safe transition to appropriate next level of care at discharge, Engage patient in therapeutic group addressing interpersonal concerns.  Short Term Goals: Engage patient in aftercare planning with referrals and resources, Facilitate patient progression through stages of change regarding substance use diagnoses and concerns and Identify triggers associated with mental health/substance abuse issues  Therapeutic Interventions: Assess for all discharge needs, 1 to 1 time with Social worker, Explore available resources and support systems, Assess for adequacy in community support network, Educate family and significant other(s) on suicide prevention, Complete Psychosocial Assessment, Interpersonal group therapy.  Evaluation of Outcomes: Progressing  Progress in Treatment: Attending groups: Yes. Participating in groups: Yes. Taking medication as prescribed: Yes. Toleration medication: Yes. Family/Significant other contact made: SPE completed with pt; pt declined to consent to family contact.  Patient understands diagnosis: Yes. Discussing patient identified problems/goals with staff: Yes. Medical problems stabilized or resolved: Yes. Denies suicidal/homicidal ideation: Yes. Issues/concerns per patient self-inventory: No. Other: n/a   New problem(s) identified: No, Describe:  n/a  New Short Term/Long Term Goal(s): detox, medication management for mood stabilization, development of comprehensive mental wellness/sobriety plan.   Discharge Plan or Barriers: CSW assessing for appropriate referrals. Pt referred to [facility] and plans to attend [facility] for outpatient mental health care. MHAG pamphlet and NA/AA list provided for [facility] for additional referrals. Pt also referred to [facility]-no bed available today.  Reason for Continuation of Hospitalization: depression/medication management   Estimated Length of Stay: Tuesday,  02/12/17  Attendees: Patient: 02/11/2017 9:07 AM  Physician: Dr. Jackquline BerlinIzediuno MD; Dr. Altamese Carolinaainville MD 02/11/2017 9:07 AM  Nursing: Marton Redwoodoni RN; Erskine SquibbJane RN 02/11/2017 9:07 AM  RN Care Manager: Onnie BoerJennifer Clark CM 02/11/2017 9:07 AM  Social Worker:  Chartered loss adjusterHeather Smart, LCSW 02/11/2017 9:07 AM  Recreational Therapist: x 02/11/2017 9:07 AM  Other: Hillery Jacksanika Lewis NP; Feliz Beamravis Money NP 02/11/2017 9:07 AM  Other:  02/11/2017 9:07 AM  Other: 02/11/2017 9:07 AM    Scribe for Treatment Team: Ledell PeoplesHeather N Smart, LCSW 02/11/2017 9:07 AM

## 2017-02-12 DIAGNOSIS — F1420 Cocaine dependence, uncomplicated: Secondary | ICD-10-CM

## 2017-02-12 MED ORDER — VITAMINS A & D EX OINT
1.0000 "application " | TOPICAL_OINTMENT | CUTANEOUS | Status: DC | PRN
  Filled 2017-02-12: qty 1

## 2017-02-12 NOTE — Progress Notes (Signed)
Nursing Progress Note 1900-0730  D) Patient presents with a pleasant mood and brightens during interaction. Patient reports his stomach feels "much better" and he has no complaints at this time. Patient attended group and is seen up in the milieu. Patient denies SI/HI/AVH or pain. Patient contracts for safety on the unit. Patient reports sleeping well with current regimen. Patient compliant with scheduled medications.  A) Emotional support given. 1:1 interaction and active listening provided. Patient medicated as prescribed. Medications and plan of care reviewed with patient. Patient verbalized understanding without further questions. Snacks and fluids provided. Opportunities for questions or concerns presented to patient. Patient encouraged to continue to work on treatment goals. Labs, vital signs and patient behavior monitored throughout shift. Patient safety maintained with q15 min safety checks. Low fall risk precautions in place and reviewed with patient; patient verbalized understanding.  R) Patient receptive to interaction with nurse. Patient remains safe on the unit at this time. Patient denies any adverse medication reactions at this time. Patient is resting in bed without complaints. Will continue to monitor.

## 2017-02-12 NOTE — Progress Notes (Signed)
Recreation Therapy Notes  Animal-Assisted Activity (AAA) Program Checklist/Progress Notes Patient Eligibility Criteria Checklist & Daily Group note for Rec TxIntervention  Date: 11.13.2018 Time: 2:45pm Location: [address] Dayroom   AAA/T Program Assumption of Risk Form signed by Patient/ or Parent Legal Guardian Yes  Patient is free of allergies or sever asthma Yes  Patient reports no fear of animals Yes  Patient reports no history of cruelty to animals Yes  Patient understands his/her participation is voluntary Yes  Behavioral Response: Did not attend.   [name] L [name], LRT/CTRS        [name] L 02/12/2017 3:01 PM 

## 2017-02-12 NOTE — Progress Notes (Signed)
Nursing Progress Note 1900-0730  D) Patient presents with pleasant mood and mild anxiety. Patient reports diarrhea and upset stomach this evening. Patient states he starting having these symptoms after beginning Prednisone. PRN Imodium given with relief. Patient also reported irritation to his rectal area due to increased alteration needs. New orders received for relief. Patient did attend group but is minimally interactive in the milieu. Patient denies SI/HI/AVH or other acute pain. Patient contracts for safety on the unit. Patient reports sleeping well with current regimen. Patient compliant with medications.  A) Emotional support given. 1:1 interaction and active listening provided. Patient medicated as prescribed. Medications and plan of care reviewed with patient. Patient verbalized understanding without further questions. Snacks and fluids provided. Opportunities for questions or concerns presented to patient. Patient encouraged to continue to work on treatment goals. Labs, vital signs and patient behavior monitored throughout shift. Patient safety maintained with q15 min safety checks. Low fall risk precautions in place and reviewed with patient; patient verbalized understanding.  R) Patient receptive to interaction with nurse. Patient remains safe on the unit at this time. Patient denies any adverse medication reactions at this time. Patient is resting in bed without complaints. Will continue to monitor.

## 2017-02-12 NOTE — Plan of Care (Signed)
Safety Care Plan Documentation  Goal: Periods of time without injury will increase Intervention: Patient is on q15 minute safety checks and low fall risk precautions. Patient verbally contracts for safety on the unit. Outcome: Patient remains safe at this time  02/12/2017 8:56 PM - Progressing by Ferrel Loganollazo, Jameshia Hayashida A, RN   Health Behavior/Discharge Planning Care Plan Documentation  Goal: Compliance for underlying treatment cause of condition will improve Intervention: Encourage compliance with groups/medication regimen.  Outcome: Patient taking medications and attending groups per plan of care. Patient verbalizes understanding and is agreeable to current plan of care.  02/12/2017 8:56 PM - Progressing by Ferrel Loganollazo, Philmore Lepore A, RN

## 2017-02-12 NOTE — [facility] Group Notes (Signed)
[facility] 02/12/2017 1:15pm  Type of Therapy: Mental Health Association Presentation  Participation Level: Active  Participation Quality: Attentive  Affect: Appropriate  Cognitive: Oriented  Insight: Developing/Improving  Engagement in Therapy: Engaged  Modes of Intervention: Discussion, Education and Socialization  Summary of Progress/Problems: Mental Health Association (MHA) Speaker came to talk about his personal journey with mental health. The pt processed ways by which to relate to the speaker. MHA speaker provided handouts and educational information pertaining to groups and services offered by the [facility]. Pt was engaged in speaker's presentation and was receptive to resources provided.    Pulte HomesHeather N Smart, LCSW 02/12/2017 12:39 PM

## 2017-02-12 NOTE — Plan of Care (Signed)
Safety Care Plan Documentation  Goal: Periods of time without injury will increase Intervention: Patient is on q15 minute safety checks and low fall risk precautions. Patient verbally contracts for safety on the unit. Outcome: Patient remains safe at this time  02/12/2017 3:12 AM - Progressing by Ferrel Loganollazo, Jason Hauge A, RN   Health Behavior/Discharge Planning Care Plan Documentation  Goal: Compliance for underlying treatment cause of condition will improve Intervention: Encourage compliance with groups/medication regimen.  Outcome: Patient taking medications and attending groups per plan of care. Patient verbalizes understanding and is agreeable to current plan of care.  02/12/2017 3:12 AM - Progressing by Ferrel Loganollazo, Kavian Peters A, RN

## 2017-02-12 NOTE — Progress Notes (Signed)
Patient ID: Cody Meyer, male   DOB: Jan 15, 1981, 36 y.o.   MRN: [id number] Patient denies SI/HI/AVH, reports his mood as being good, reports his appetite as fair, denies any other concerns this morning.  Pt took all of his meds this morning as scheduled,  Q15 minute safety checks in place, will continue to monitor.
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
## 2017-02-13 MED ORDER — PREDNISONE 10 MG (21) PO TBPK: ORAL_TABLET | ORAL | Status: DC

## 2017-02-13 MED ORDER — FLUOXETINE HCL 20 MG PO CAPS: 20.0000 mg | ORAL_CAPSULE | Freq: Every day | ORAL | 0 refills | Status: AC

## 2017-02-13 MED ORDER — FLUOXETINE HCL 20 MG PO CAPS
20.0000 mg | ORAL_CAPSULE | Freq: Every day | ORAL | Status: DC
  Filled 2017-02-13 (×2): qty 14

## 2017-02-13 MED ORDER — RISPERIDONE 1 MG PO TABS: 1.0000 mg | ORAL_TABLET | Freq: Every day | ORAL | 0 refills | Status: AC

## 2017-02-13 MED ORDER — RISPERIDONE 1 MG PO TABS
1.0000 mg | ORAL_TABLET | Freq: Every day | ORAL | Status: DC
  Filled 2017-02-13: qty 14

## 2017-02-13 MED ORDER — TRAZODONE HCL 150 MG PO TABS: 150.0000 mg | ORAL_TABLET | Freq: Every day | ORAL | 0 refills | Status: AC

## 2017-02-13 MED ORDER — PHENOL 1.4 % MT LIQD: 1.0000 | OROMUCOSAL | 0 refills | Status: AC | PRN

## 2017-02-13 MED ORDER — VITAMINS A & D EX OINT: 1.0000 "application " | TOPICAL_OINTMENT | CUTANEOUS | 0 refills | Status: AC | PRN

## 2017-02-13 MED ORDER — LORATADINE 10 MG PO TABS
10.0000 mg | ORAL_TABLET | Freq: Every day | ORAL | Status: DC
  Filled 2017-02-13 (×2): qty 14

## 2017-02-13 MED ORDER — NICOTINE 21 MG/24HR TD PT24: 21.0000 mg | MEDICATED_PATCH | Freq: Every day | TRANSDERMAL | 0 refills | Status: AC

## 2017-02-13 MED ORDER — TRAZODONE HCL 150 MG PO TABS
150.0000 mg | ORAL_TABLET | Freq: Every day | ORAL | Status: DC
Start: 2017-02-13 — End: 2017-02-13
  Filled 2017-02-13: qty 14
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
## 2017-02-13 NOTE — [facility] Suicide Risk Assessment (Signed)
[facility]   Principal Problem: MDD (major depressive disorder) Discharge Diagnoses:  Patient Active Problem List   Diagnosis Date Noted  . Alcohol use disorder, severe, dependence (HCC) [F10.20] 02/05/2017  . Severe major depression (HCC) [F32.2] 02/04/2017  . MDD (major depressive disorder) [F32.9] 02/04/2017    Total Time spent with patient: 30 minutes  Musculoskeletal: Strength & Muscle Tone: within normal limits Gait & Station: normal Patient leans: N/A  Psychiatric Specialty Exam: Review of Systems  Constitutional: Negative for chills and fever.  Respiratory: Negative for cough and shortness of breath.   Cardiovascular: Negative for chest pain.  Gastrointestinal: Positive for diarrhea. Negative for heartburn and nausea.    Blood pressure 112/63, pulse 90, temperature 97.9 F (36.6 C), temperature source Oral, resp. rate 16, height 5' 6.5" (1.689 m), weight 77.8 kg (171 lb 8 oz), SpO2 99 %.Body mass index is 27.27 kg/m.  General Appearance: Casual  Eye Contact::  Good  Speech:  Clear and Coherent and Normal Rate  Volume:  Normal  Mood:  Euthymic  Affect:  Appropriate and Congruent  Thought Process:  Coherent and Goal Directed  Orientation:  Full (Time, Place, and Person)  Thought Content:  Logical  Suicidal Thoughts:  No  Homicidal Thoughts:  No  Memory:  Immediate;   Good Recent;   Good Remote;   Good  Judgement:  Fair  Insight:  Fair  Psychomotor Activity:  Normal  Concentration:  Good  Recall:  Good  Fund of Knowledge:Good  Language: Good  Akathisia:  No  Handed:    AIMS (if indicated):     Assets:  Communication Skills Desire for Improvement Leisure Time Physical Health Resilience Social Support  Sleep:  Number of Hours: 5.75  Cognition: WNL  ADL's:  Intact   Mental Status Per Nursing Assessment::   On Admission:  Self-harm thoughts  Demographic Factors:  Male and Low socioeconomic status  Loss  Factors: Financial problems/change in socioeconomic status  Historical Factors: Family history of mental illness or substance abuse and Impulsivity  Risk Reduction Factors:   Positive social support, Positive therapeutic relationship and Positive coping skills or problem solving skills  Continued Clinical Symptoms:  Depression:   Comorbid alcohol abuse/dependence Alcohol/Substance Abuse/Dependencies  Cognitive Features That Contribute To Risk:  None    Suicide Risk:  Minimal: No identifiable suicidal ideation.  Patients presenting with no risk factors but with morbid ruminations; may be classified as minimal risk based on the severity of the depressive symptoms  Follow-up Information    [facility] Follow up on 02/14/2017.   Why:  Screening for possible admission on Thursday, 02/14/17 at 7:45AM. Please bring: photo ID/proof of [place] residency, 14 day supply of meds/prescriptions provided by hospital, and clothing. Thank you.  Contact information: [address] [place] [address] [phone]        [facility] Follow up.   Why:  Walk in within 3 days of hospital/rehab discharge to be assessed for outpatient mental health services including medication management and counseling. Walk in hours: Mon-Fri 8am-9am. Thank you.  Contact information: [address] [place] [address] [phone]         Subjective data: Cody Langeerlie Meyer is 36 y/o M with history of MDD, alcohol use disorder, and stimulant use disorder who was admitted with worsening depression, SI, use of alcohol, and use of cocaine. Pt was restarted on medications including risperdal and observed on the inpatient psychiatry unit. Today upon evaluation, he reports  stability of his mood symptoms overall. He denies SI/HI/AH/VH. He has physical complaint of some diarrhea which has been responding to imodium. Pt reports that overall he is tolerating his medications without difficulty. He is  future oriented about [facility] int he AM. Pt was able to engage in safety planning including [facility] or contact emergency services if he feels unable to maintain his own safety. Pt agreed to continue his current regimen without changes. He had no further questions, comments, or concerns.   Plan Of Care/Follow-up recommendations:  - Discharge to outpatient level of care ([facility]) - MDD with psychotic features - Continue prozac 20mg  qDay - Continue risperdal 1mg  qhs - Insomnia - Continue trazodone 100mg  qhs - Sarcoidosis - Continue prednisone taper  Activity:  as tolerated Diet:  normal Tests:  NA Other:  see above for DC plan  Cody Likenshristopher T Zanasia Hickson, MD 02/13/2017, 3:36 PM

## 2017-02-13 NOTE — Progress Notes (Addendum)
D: Patient observed up and visible in dayroom. Patient states he continues to have diarrhea since starting the prednisone and was unable to go to breakfast. Angry at meal that was brought back to him as he felt it was not adequate in quantities. Patient's affect flat, irritable with congruent mood.  Denies pain and states diarrhea is lessened since receiving imodium this AM. Does not desire repeat dose.   A: Medicated per orders, no prns requested or required. Level III obs in place for safety. Emotional support offered. Encouraged completion of Suicide Safety Plan, self inventory and programming participation. Discussed POC with MD, SW.   R: Patient verbalizes understanding of POC. Patient denies SI/AVH however confirms HI. Asked x 2 for patient to elaborate but he refuses and walks away. He remains safe on level III obs. Will continue to monitor closely and make verbal contact frequently.
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
## 2017-02-13 NOTE — Progress Notes (Signed)
Recreation Therapy Notes  Date:  02/13/17 Time: 0930 Location: 300 [address] Dayroom  Group Topic: Stress Management  Goal Area(s) Addresses:  Patient will verbalize importance of using healthy stress management.  Patient will identify positive emotions associated with healthy stress management.   Intervention: Stress Management  Activity :  Guided Imagery.  LRT introduced the stress management technique of guided imagery.  LRT read a script to help patients envision their safe and peaceful place.  Patients were to follow along as LRT read script.  Education:  Stress Management, Discharge Planning.   Education Outcome: Acknowledges edcuation/In group clarification offered/Needs additional education  Clinical Observations/Feedback: Pt did not attend group.    Caroll RancherMarjette Zephan Beauchaine, LRT/CTRS         Caroll RancherLindsay, Terrace Fontanilla A 02/13/2017 12:48 PM

## 2017-02-13 NOTE — [facility] Group Notes (Signed)
LCSW Group Therapy Note   02/13/2017 1:15pm   Type of Therapy and Topic:  Group Therapy:  Overcoming Obstacles   Participation Level:  Active   Description of Group:    In this group patients will be encouraged to explore what they see as obstacles to their own wellness and recovery. They will be guided to discuss their thoughts, feelings, and behaviors related to these obstacles. The group will process together ways to cope with barriers, with attention given to specific choices patients can make. Each patient will be challenged to identify changes they are motivated to make in order to overcome their obstacles. This group will be process-oriented, with patients participating in exploration of their own experiences as well as giving and receiving support and challenge from other group members.   Therapeutic Goals: 1. Patient will identify personal and current obstacles as they relate to admission. 2. Patient will identify barriers that currently interfere with their wellness or overcoming obstacles.  3. Patient will identify feelings, thought process and behaviors related to these barriers. 4. Patient will identify two changes they are willing to make to overcome these obstacles:      Summary of Patient Progress   Cody Meyer was attentive and engaged during today's processing group. He shared that his biggest obstacle involves maintaining his sobriety after discharge. Cody Meyer [facility] "I'm leaving in the morning and really think I'll do well there." He continues to make progress in the group setting with improving insight.    Therapeutic Modalities:   Cognitive Behavioral Therapy Solution Focused Therapy Motivational Interviewing Relapse Prevention Therapy  Ledell PeoplesHeather N Smart, LCSW 02/13/2017 3:20 PM

## 2017-02-13 NOTE — Plan of Care (Signed)
Patient verbalizes understanding of information, education provided. 

## 2017-02-13 NOTE — Progress Notes (Signed)
  [facility] :  Will you be returning to the same living situation after discharge:  No. Pt [facility]15 At discharge, do you have transportation home?: Yes,  taxi voucher in chart. PATIENT MUST DISCHARGE NO LATER THAN 7AM ON THURSDAY 11/15 IN ORDER TO [facility]. Do you have the ability to pay for your medications: Yes,  mental health  Release of information consent forms completed and submitted to medical records by CSW.  Patient to Follow up at: Follow-up Information    [facility] Follow up on 02/14/2017.   Why:  Screening for possible admission on Thursday, 02/14/17 at 7:45AM. Please bring: photo ID/proof of [place] residency, 14 day supply of meds/prescriptions provided by hospital, and clothing. Thank you.  Contact information: [address] [place] [address] [phone]        [facility] Follow up.   Why:  Walk in within 3 days of hospital/rehab discharge to be assessed for outpatient mental health services including medication management and counseling. Walk in hours: Mon-Fri 8am-9am. Thank you.  Contact information: [address] [place] [address] [phone]           Next level of care provider has access to [facility] Link:no  Safety Planning and Suicide Prevention discussed: Yes,  SPE completed with pt; pt declined to consent to family contact. SPI Pamphlet and mobile crisis information provided to pt.   Have you used any form of tobacco in the last 30 days? (Cigarettes, Smokeless Tobacco, Cigars, and/or Pipes): Yes  Has patient been referred to the Quitline?: Patient refused referral  Patient has been referred for addiction treatment: Yes  Pulte HomesHeather N Smart, LCSW 02/13/2017, 9:09 AM

## 2017-02-14 NOTE — Progress Notes (Signed)
Pt has been observed sitting in the dayroom most of the evening talking with peers and watching TV. He reports his day has been ok.  He denies SI/HI/AVH.  Previous RN reported that he was irritable early in the day, but he seems to have calmed down and is more pleasant this evening.  He voiced no needs or concerns.  He is aware that [facility] for a screening for admission.  Support and encouragement offered.  Discharge plans are in process.  Safety maintained with q15 minute checks.

## 2017-02-14 NOTE — Progress Notes (Signed)
Pt was discharged this morning to go [facility].  Discharge instructions were reviewed with patient and all paperwork was signed.  Patient's belongings were returned to him.  Pt was given his supply of medications and prescriptions.  Pt was also given his last dose of prednisone this morning before discharge.  Pt states he is ready for rehab and wants to stay sober.  He denies SI/HI/AVH.  Pt was provided a breakfast tray and will travel by [employer] [facility].

## 2017-02-15 ENCOUNTER — Emergency Department (HOSPITAL_COMMUNITY)
Admission: EM | Admit: 2017-02-15 | Discharge: 2017-02-16 | Disposition: A | Discharge status: Home / Self Care | Payer: Self-pay | Attending: Emergency Medicine | Admitting: Emergency Medicine

## 2017-02-15 ENCOUNTER — Other Ambulatory Visit: Payer: Self-pay

## 2017-02-15 ENCOUNTER — Encounter (HOSPITAL_COMMUNITY): Payer: Self-pay

## 2017-02-15 ENCOUNTER — Emergency Department (HOSPITAL_COMMUNITY): Payer: Self-pay

## 2017-02-15 DIAGNOSIS — R45851 Suicidal ideations: Secondary | ICD-10-CM | POA: Insufficient documentation

## 2017-02-15 DIAGNOSIS — F333 Major depressive disorder, recurrent, severe with psychotic symptoms: Secondary | ICD-10-CM | POA: Insufficient documentation

## 2017-02-15 DIAGNOSIS — F1420 Cocaine dependence, uncomplicated: Secondary | ICD-10-CM | POA: Insufficient documentation

## 2017-02-15 DIAGNOSIS — F17210 Nicotine dependence, cigarettes, uncomplicated: Secondary | ICD-10-CM | POA: Insufficient documentation

## 2017-02-15 DIAGNOSIS — F1994 Other psychoactive substance use, unspecified with psychoactive substance-induced mood disorder: Secondary | ICD-10-CM

## 2017-02-15 DIAGNOSIS — Z79899 Other long term (current) drug therapy: Secondary | ICD-10-CM | POA: Insufficient documentation

## 2017-02-15 DIAGNOSIS — F1020 Alcohol dependence, uncomplicated: Secondary | ICD-10-CM | POA: Insufficient documentation

## 2017-02-15 LAB — I-STAT TROPONIN, ED: Troponin i, poc: 0 ng/mL (ref 0.00–0.08)

## 2017-02-15 LAB — CBC
HEMATOCRIT: 46.3 % (ref 39.0–52.0)
Hemoglobin: 16.2 g/dL (ref 13.0–17.0)
MCH: 30.9 pg (ref 26.0–34.0)
MCHC: 35 g/dL (ref 30.0–36.0)
MCV: 88.2 fL (ref 78.0–100.0)
Platelets: 264 10*3/uL (ref 150–400)
RBC: 5.25 MIL/uL (ref 4.22–5.81)
RDW: 12.9 % (ref 11.5–15.5)
WBC: 15.4 10*3/uL — AB (ref 4.0–10.5)

## 2017-02-15 LAB — BASIC METABOLIC PANEL
Anion gap: 12 (ref 5–15)
BUN: 8 mg/dL (ref 6–20)
CHLORIDE: 103 mmol/L (ref 101–111)
CO2: 23 mmol/L (ref 22–32)
Calcium: 9.4 mg/dL (ref 8.9–10.3)
Creatinine, Ser: 0.85 mg/dL (ref 0.61–1.24)
GFR calc Af Amer: >60 mL/min (ref >60)
GFR calc non Af Amer: >60 mL/min (ref >60)
Glucose, Bld: 118 mg/dL — ABNORMAL HIGH (ref 65–99)
POTASSIUM: 3.2 mmol/L — AB (ref 3.5–5.1)
SODIUM: 138 mmol/L (ref 135–145)

## 2017-02-15 LAB — RAPID URINE DRUG SCREEN, HOSP PERFORMED
Amphetamines: NOT DETECTED
Barbiturates: NOT DETECTED
Benzodiazepines: NOT DETECTED
COCAINE: POSITIVE — AB
OPIATES: NOT DETECTED
TETRAHYDROCANNABINOL: NOT DETECTED

## 2017-02-15 LAB — ACETAMINOPHEN LEVEL: Acetaminophen (Tylenol), Serum: <10 ug/mL — ABNORMAL LOW (ref 10–30)

## 2017-02-15 LAB — ETHANOL: ALCOHOL ETHYL (B): 189 mg/dL — AB (ref <10)

## 2017-02-15 LAB — SALICYLATE LEVEL

## 2017-02-15 MED ORDER — ALUM & MAG HYDROXIDE-SIMETH 200-200-20 MG/5ML PO SUSP
30.0000 mL | Freq: Four times a day (QID) | ORAL | Status: DC | PRN
  Administered 2017-02-15: 30 mL via ORAL
  Filled 2017-02-15: qty 30

## 2017-02-15 MED ORDER — FLUOXETINE HCL 20 MG PO CAPS
20.0000 mg | ORAL_CAPSULE | Freq: Every day | ORAL | Status: DC
  Administered 2017-02-15 – 2017-02-16 (×2): 20 mg via ORAL
  Filled 2017-02-15 (×2): qty 1

## 2017-02-15 MED ORDER — ONDANSETRON HCL 4 MG PO TABS: 4.0000 mg | ORAL_TABLET | Freq: Three times a day (TID) | ORAL | Status: DC | PRN

## 2017-02-15 MED ORDER — ZOLPIDEM TARTRATE 5 MG PO TABS: 5.0000 mg | ORAL_TABLET | Freq: Every evening | ORAL | Status: DC | PRN

## 2017-02-15 MED ORDER — NICOTINE 21 MG/24HR TD PT24
21.0000 mg | MEDICATED_PATCH | Freq: Every day | TRANSDERMAL | Status: DC
  Filled 2017-02-15: qty 1

## 2017-02-15 MED ORDER — TRAZODONE HCL 50 MG PO TABS
150.0000 mg | ORAL_TABLET | Freq: Every day | ORAL | Status: DC
  Administered 2017-02-15: 150 mg via ORAL
  Filled 2017-02-15: qty 1

## 2017-02-15 MED ORDER — RISPERIDONE 1 MG PO TABS
1.0000 mg | ORAL_TABLET | Freq: Every day | ORAL | Status: DC
  Administered 2017-02-15: 1 mg via ORAL
  Filled 2017-02-15: qty 1

## 2017-02-15 MED ORDER — ACETAMINOPHEN 325 MG PO TABS: 650.0000 mg | ORAL_TABLET | ORAL | Status: DC | PRN

## 2017-02-15 NOTE — ED Triage Notes (Signed)
Per [facility] pt walking when experienced chest pain rated 8/10 and SOB. EMS reports patient stated he was thankful for calling them because he was "going to hang himself." EMS reports giving patient 324 ASA. EMS reports patient using crack cocaine and a beer around 1830.

## 2017-02-15 NOTE — ED Notes (Signed)
With TTS 

## 2017-02-15 NOTE — ED Notes (Signed)
Patient taken to XRAY

## 2017-02-15 NOTE — Progress Notes (Signed)
CSW reviewed pt chart. Per Assunta FoundShuvon Rankin NP, pt meets criteria for inpatient hospitalization.  Pt referral packet sent to the following hospitals: [facility]     [facility]  [facility]  [facility]  [facility]  [facility]  [facility]  [facility]        Disposition:  CSW will continue to follow for placement.  Timmothy EulerJean T. Kaylyn LimSutter, MSW, LCSWA Disposition Clinical Social Work [phone] (cell) [phone] (office)
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
## 2017-02-15 NOTE — ED Notes (Signed)
TTS not working

## 2017-02-15 NOTE — ED Notes (Signed)
TTS being done at bedside 

## 2017-02-15 NOTE — Consult Note (Signed)
2:37 pm 02/15/17  Attempted telepsych assessment but [facility] having problems with telepsych machine.  Will call when ready for telepsych. 3:10 pm 02/15/17 Attempted telepsych assessment again and [facility] continues to have problems with telepsych machine 1.  Informed could get machine 2 from ED Peds and call to let me know and would try to do telepsych assessment again.     3:30 pm 02/15/17 Telepsych assessment:  Holley Bouche, 36 y.o., male patient presented to [facility] with complaints to suicidal ideation.  Patient recently discharged from [facility] 02/14/17 and sent to [facility].  Patient reports that he was asked to leave [facility] and on same day he used crack cocaine.  Patient seen via telepsych by this provider on 02/15/17.  Chart reviewed and consulted with Dr. Dwyane Dee.  On evaluation Clifford Benninger reports that he was just discharged from [facility] and then went to [facility] for rehab substance abuse treatment.  "I did all that paper work and had got to my room when they told me I had to leave cause of my Sarcoidosis and that they did not have medical staff.  They still got my charge cards and my medicine.  I had to walk cause they wouldn't even give me a ride."  Patient states that he met up with the wrong crowd and did cocaine.  Patient reports that he came back to the hospital because he was having thoughts of hurting himself "actually killing myself."  I just feel like giving up.  The one time I tried to get help.  I got kicked out."  Patient states that he works for a [employer] but he is homeless and has no plan for when he is discharged from hospital.  Patient denied suicidal ideation but has made the statement that he wants to be discharge so that he can just kill himself.  Patient also denied homicidal/ideation, psychosis, and paranoia.  During evaluation patient sitting on side of bed. Patient appears depressed with flat affect, guarded and only giving limited information on his plans after discharge.  When asked  what his plans were patient gave vague response "I got do what I got to do." but would not elaborate on what he had to do.  Patient was alert/oriented x 4, calm/cooperative and did not appear to be responding to internal/external stimuli.  Patient is unable to contract for safety.   Recommendations: Inpatient psychiatric treatment.  Continue current psychotropics.     Disposition: Recommend psychiatric Inpatient admission when medically cleared.

## 2017-02-15 NOTE — ED Notes (Signed)
Belongings placed in dirty utility underneath sink (3 bags). Valuables locked away with security.
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
## 2017-02-15 NOTE — ED Notes (Addendum)
Regular Diet has been Ordered for Dinner. 

## 2017-02-16 NOTE — ED Provider Notes (Signed)
Patient awake and alert. States slept well, ate breakfast, normal appetite.  Patient exhibits normal mood and affect.   No SI/HI.  Does not appear acutely depressed. No psychosis.   Discussed outpatient resources[facility].   Patient currently appears stable for d/c.  Return precautions provided.      Cathren LaineSteinl, [place], MD 02/16/17 [phone]

## 2017-02-16 NOTE — ED Notes (Signed)
Bus pass and sandwich given as requested.

## 2017-02-16 NOTE — Discharge Instructions (Signed)
It was our pleasure to provide your ER care today - we hope that you feel better.  Avoid alcohol and cocaine use - see resource guide provided for community resources/assistance.  For mental health issues and/or crisis, [facility].  For medical care, follow up with primary care doctor in the next 1-2 weeks.  Return to ER if worse, new symptoms, fevers, trouble breathing, other medical emergency.

## 2017-02-16 NOTE — [facility] Counselor (Signed)
TTS reassessment note: Pt states that nothing has changed and he has no where to go. He denies SI and HI but made concerning statements when asked what would his plan be if he were to leave. He stated "you don't want to know that". Pt was irritable and upset about [facility] [place] and states that he had to walk down [address] to get back. He also states that his check card and medication is still there. Pt states that this was his first time seeking help for substance abuse and he states that he will "never do that again". Pt displays poor insight and judgement and continues to meet inpatient criteria for safety and stabilization.   [address] BarracksLPC, LCAS

## 2017-02-16 NOTE — ED Notes (Signed)
Dr Denton LankSteinl assessed pt. Pt aware being d/c'd.

## 2017-10-22 ENCOUNTER — Encounter (HOSPITAL_COMMUNITY): Payer: Self-pay

## 2017-10-22 ENCOUNTER — Ambulatory Visit (HOSPITAL_COMMUNITY)
Admission: EM | Admit: 2017-10-22 | Discharge: 2017-10-22 | Disposition: A | Discharge status: Home / Self Care | Payer: Self-pay | Attending: Family Medicine | Admitting: Family Medicine

## 2017-10-22 DIAGNOSIS — F322 Major depressive disorder, single episode, severe without psychotic features: Secondary | ICD-10-CM | POA: Insufficient documentation

## 2017-10-22 DIAGNOSIS — F17210 Nicotine dependence, cigarettes, uncomplicated: Secondary | ICD-10-CM | POA: Insufficient documentation

## 2017-10-22 DIAGNOSIS — Z79899 Other long term (current) drug therapy: Secondary | ICD-10-CM | POA: Insufficient documentation

## 2017-10-22 DIAGNOSIS — R369 Urethral discharge, unspecified: Secondary | ICD-10-CM | POA: Insufficient documentation

## 2017-10-22 DIAGNOSIS — R300 Dysuria: Secondary | ICD-10-CM

## 2017-10-22 DIAGNOSIS — N390 Urinary tract infection, site not specified: Secondary | ICD-10-CM | POA: Insufficient documentation

## 2017-10-22 DIAGNOSIS — F1020 Alcohol dependence, uncomplicated: Secondary | ICD-10-CM | POA: Insufficient documentation

## 2017-10-22 DIAGNOSIS — N50819 Testicular pain, unspecified: Secondary | ICD-10-CM | POA: Insufficient documentation

## 2017-10-22 LAB — POCT URINALYSIS DIP (DEVICE)
BILIRUBIN URINE: NEGATIVE
GLUCOSE, UA: NEGATIVE mg/dL
Hgb urine dipstick: NEGATIVE
KETONES UR: NEGATIVE mg/dL
Nitrite: NEGATIVE
Protein, ur: NEGATIVE mg/dL
SPECIFIC GRAVITY, URINE: 1.015 (ref 1.005–1.030)
Urobilinogen, UA: 0.2 mg/dL (ref 0.0–1.0)
pH: 6 (ref 5.0–8.0)

## 2017-10-22 MED ORDER — SULFAMETHOXAZOLE-TRIMETHOPRIM 800-160 MG PO TABS: 1.0000 | ORAL_TABLET | Freq: Two times a day (BID) | ORAL | 0 refills | Status: AC

## 2017-10-22 NOTE — ED Triage Notes (Signed)
Pt presents with urinary tract symptoms 
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
## 2017-10-22 NOTE — Discharge Instructions (Signed)
We will start you on bactrim to cover for urinary tract infection.  Keep hydrated, your urine should be clear to pale yellow in color.  Testing sent, you will be contacted with any positive results. Refrain from sexual activity and alcohol use for the next 7 days. Monitor for any worsening of symptoms, fever, abdominal pain, nausea, vomiting, penile lesion, testicular swelling, worsening testicular pain, to follow up for reevaluation.

## 2017-10-23 LAB — URINE CULTURE: CULTURE: NO GROWTH

## 2017-10-23 LAB — URINE CYTOLOGY ANCILLARY ONLY
CHLAMYDIA, DNA PROBE: NEGATIVE
Neisseria Gonorrhea: POSITIVE — AB
Trichomonas: NEGATIVE

## 2017-10-26 ENCOUNTER — Telehealth (HOSPITAL_COMMUNITY): Payer: Self-pay

## 2017-10-26 NOTE — Telephone Encounter (Signed)
Gonorrhea is positive.  Patient should return as soon as possible to the urgent care for treatment with IM rocephin 250mg  and po zithromax 1g. Patient will not need to see a provider unless there are new symptoms she would like evaluated. Need to educate patient to refrain from sexual intercourse for now and for 7 days after treatment to give the medicine time to work. Sexual partners need to be notified and tested/treated. Condoms may reduce risk of reinfection.  [facility] notified. Attempted to reach patient. No answer at this time. Voicemail left.

## 2017-10-29 ENCOUNTER — Telehealth (HOSPITAL_COMMUNITY): Payer: Self-pay

## 2017-10-29 NOTE — Telephone Encounter (Signed)
Attempted to reach patient x 2. No answer. 

## 2017-10-29 NOTE — Telephone Encounter (Signed)
Attempted to reach patient x 3. No answer. Letter sent. 

## 2017-10-30 ENCOUNTER — Ambulatory Visit (HOSPITAL_COMMUNITY)
Admission: EM | Admit: 2017-10-30 | Discharge: 2017-10-30 | Disposition: A | Discharge status: Home / Self Care | Payer: Self-pay | Attending: Family Medicine | Admitting: Family Medicine

## 2017-10-30 DIAGNOSIS — A549 Gonococcal infection, unspecified: Secondary | ICD-10-CM

## 2017-10-30 MED ORDER — AZITHROMYCIN 250 MG PO TABS
ORAL_TABLET | ORAL | Status: AC
  Filled 2017-10-30: qty 4

## 2017-10-30 MED ORDER — AZITHROMYCIN 250 MG PO TABS
1000.0000 mg | ORAL_TABLET | Freq: Once | ORAL | Status: AC
  Administered 2017-10-30: 1000 mg via ORAL

## 2017-10-30 MED ORDER — CEFTRIAXONE SODIUM 250 MG IJ SOLR
250.0000 mg | Freq: Once | INTRAMUSCULAR | Status: AC
  Administered 2017-10-30: 250 mg via INTRAMUSCULAR

## 2017-10-30 MED ORDER — CEFTRIAXONE SODIUM 250 MG IJ SOLR
INTRAMUSCULAR | Status: AC
  Filled 2017-10-30: qty 250

## 2017-10-30 MED ORDER — LIDOCAINE HCL (PF) 1 % IJ SOLN
INTRAMUSCULAR | Status: AC
  Filled 2017-10-30: qty 2

## 2017-10-30 NOTE — ED Notes (Signed)
Bed: UC01 Expected date: 10/30/17 Expected time:  Means of arrival:  Comments: For APPTS 

## 2017-10-30 NOTE — ED Notes (Signed)
Pt presented for STD treatment following receiving lab results from courtney by phone. Pt was educated about no sex for seven days

## 2019-03-25 ENCOUNTER — Encounter (HOSPITAL_COMMUNITY): Payer: Self-pay

## 2019-03-25 ENCOUNTER — Ambulatory Visit (HOSPITAL_COMMUNITY)
Admission: EM | Admit: 2019-03-25 | Discharge: 2019-03-25 | Disposition: A | Discharge status: Home / Self Care | Payer: Self-pay | Attending: Family Medicine | Admitting: Family Medicine

## 2019-03-25 ENCOUNTER — Other Ambulatory Visit: Payer: Self-pay

## 2019-03-25 DIAGNOSIS — N5089 Other specified disorders of the male genital organs: Secondary | ICD-10-CM | POA: Insufficient documentation

## 2019-03-25 DIAGNOSIS — N5082 Scrotal pain: Secondary | ICD-10-CM | POA: Insufficient documentation

## 2019-03-25 DIAGNOSIS — R369 Urethral discharge, unspecified: Secondary | ICD-10-CM | POA: Insufficient documentation

## 2019-03-25 LAB — POCT URINALYSIS DIP (DEVICE)
Glucose, UA: NEGATIVE mg/dL
Nitrite: NEGATIVE
Protein, ur: 100 mg/dL — AB
Specific Gravity, Urine: 1.025 (ref 1.005–1.030)
Urobilinogen, UA: >=8 mg/dL (ref 0.0–1.0)
pH: 6 (ref 5.0–8.0)

## 2019-03-25 MED ORDER — AZITHROMYCIN 250 MG PO TABS
ORAL_TABLET | ORAL | Status: AC
  Filled 2019-03-25: qty 4

## 2019-03-25 MED ORDER — AZITHROMYCIN 250 MG PO TABS
1000.0000 mg | ORAL_TABLET | Freq: Once | ORAL | Status: AC
  Administered 2019-03-25: 1000 mg via ORAL

## 2019-03-25 MED ORDER — CEFTRIAXONE SODIUM 250 MG IJ SOLR
250.0000 mg | Freq: Once | INTRAMUSCULAR | Status: AC
  Administered 2019-03-25: 14:00:00 250 mg via INTRAMUSCULAR

## 2019-03-25 MED ORDER — CEFTRIAXONE SODIUM 250 MG IJ SOLR
INTRAMUSCULAR | Status: AC
  Filled 2019-03-25: qty 250
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
## 2019-03-25 NOTE — ED Triage Notes (Signed)
Patient presents to Urgent Care with complaints of right testicle swelling since two days ago. Patient reports it is painful, took a percocet for pain earlier today.

## 2019-03-25 NOTE — Discharge Instructions (Signed)

## 2019-03-26 ENCOUNTER — Telehealth: Payer: Self-pay | Admitting: Emergency Medicine

## 2019-03-26 LAB — URINE CULTURE: Culture: NO GROWTH

## 2019-03-26 LAB — CYTOLOGY, (ORAL, ANAL, URETHRAL) ANCILLARY ONLY
Chlamydia: NEGATIVE
Neisseria Gonorrhea: POSITIVE — AB
Trichomonas: NEGATIVE

## 2019-03-26 NOTE — Telephone Encounter (Signed)
Test for gonorrhea was positive. This was treated at the urgent care visit with IM rocephin 250mg  and po zithromax 1g. Pt needs education to refrain from sexual intercourse for 7 days after treatment to give the medicine time to work. Sexual partners need to be notified and tested/treated. Condoms may reduce risk of reinfection. Recheck or followup with PCP for further evaluation if symptoms are not improving. [facility] notified.  Patient contacted by phone and made aware of    results. Pt verbalized understanding and had all questions answered.

## 2019-05-16 IMAGING — DX DG CHEST 2V
2 series · 2 of 2 positions shown · non-contrast
Comparison: Chest radiograph dated 02/03/2017

CLINICAL DATA: 36-year-old male with chest pain.

EXAM:
CHEST  2 VIEW

[chest pa]
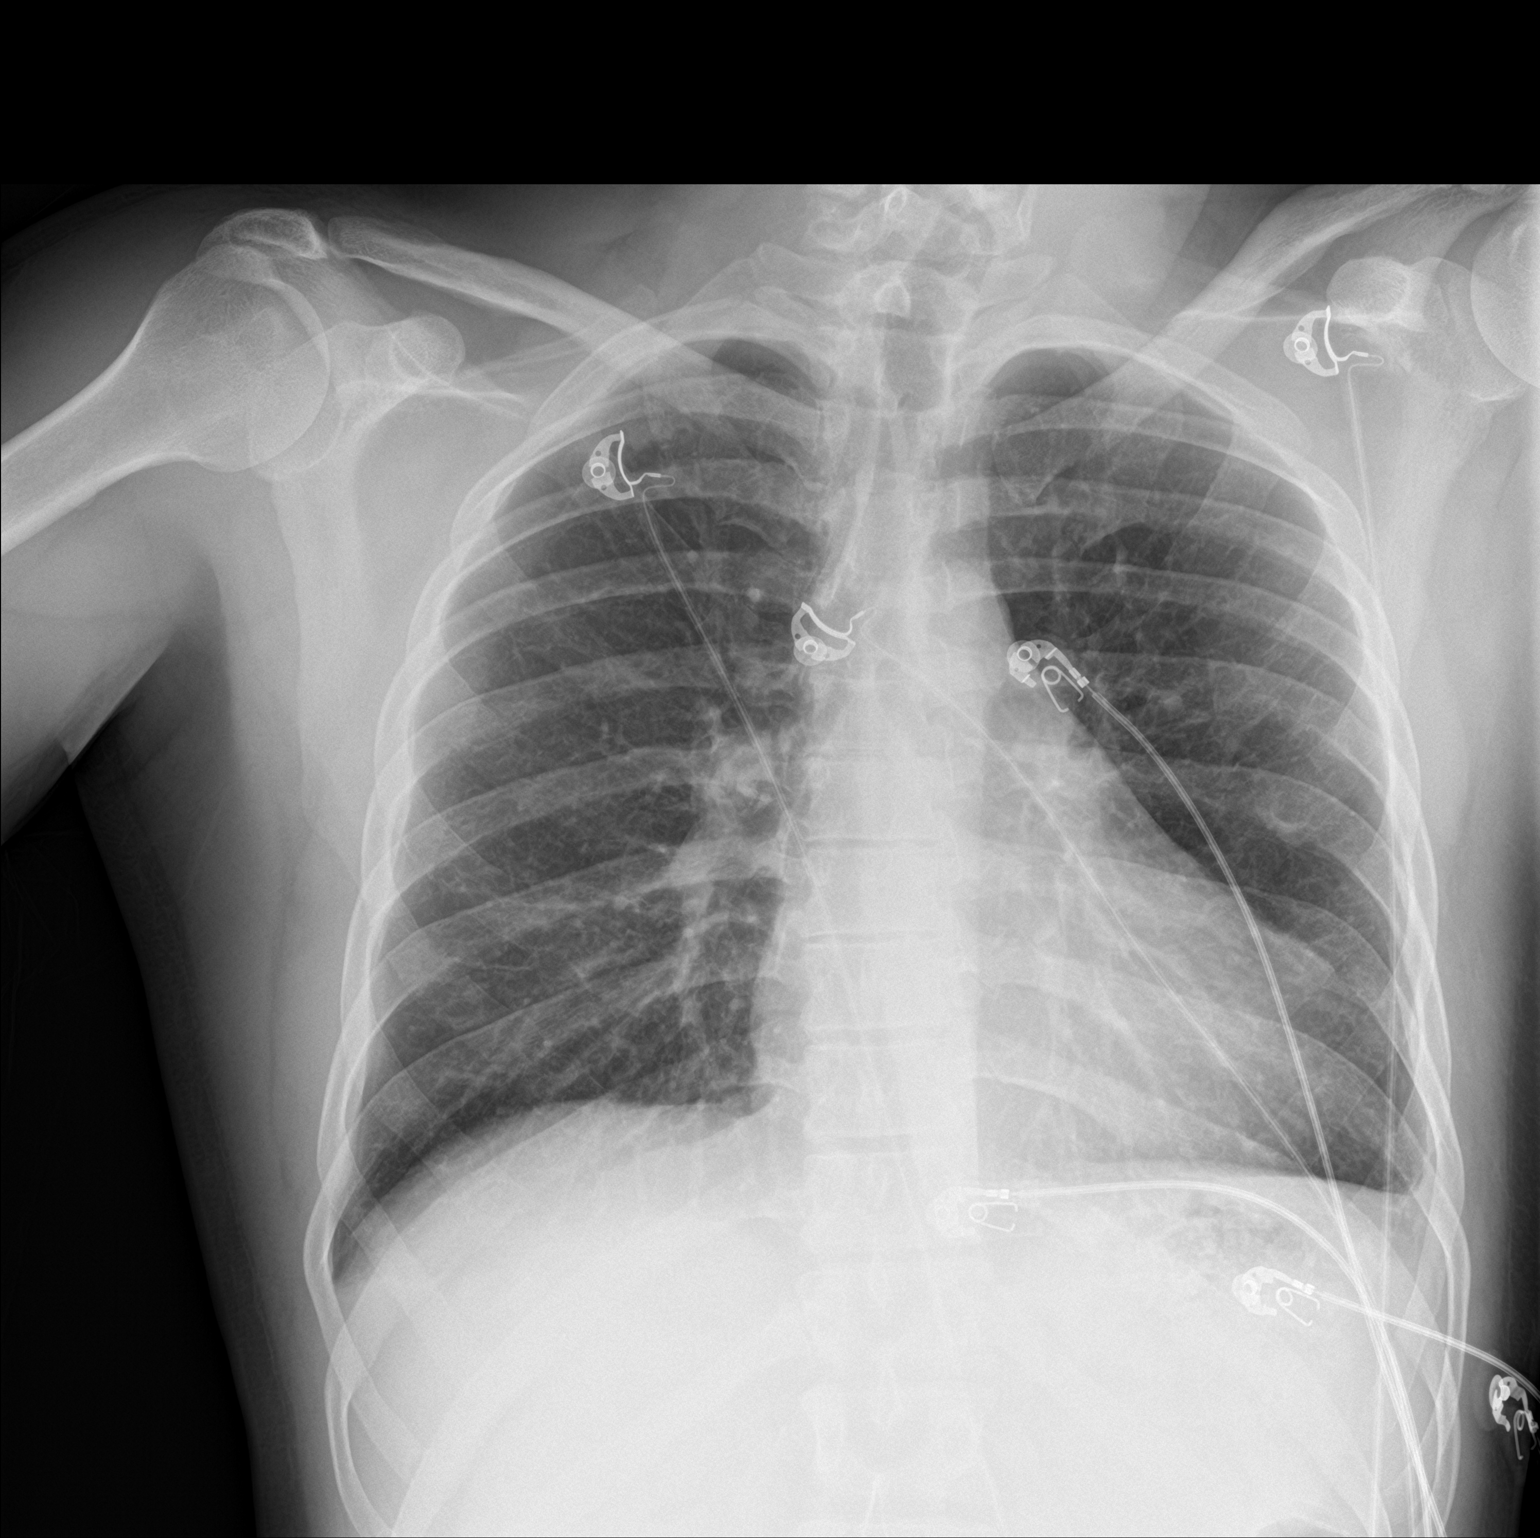

[chest lat]
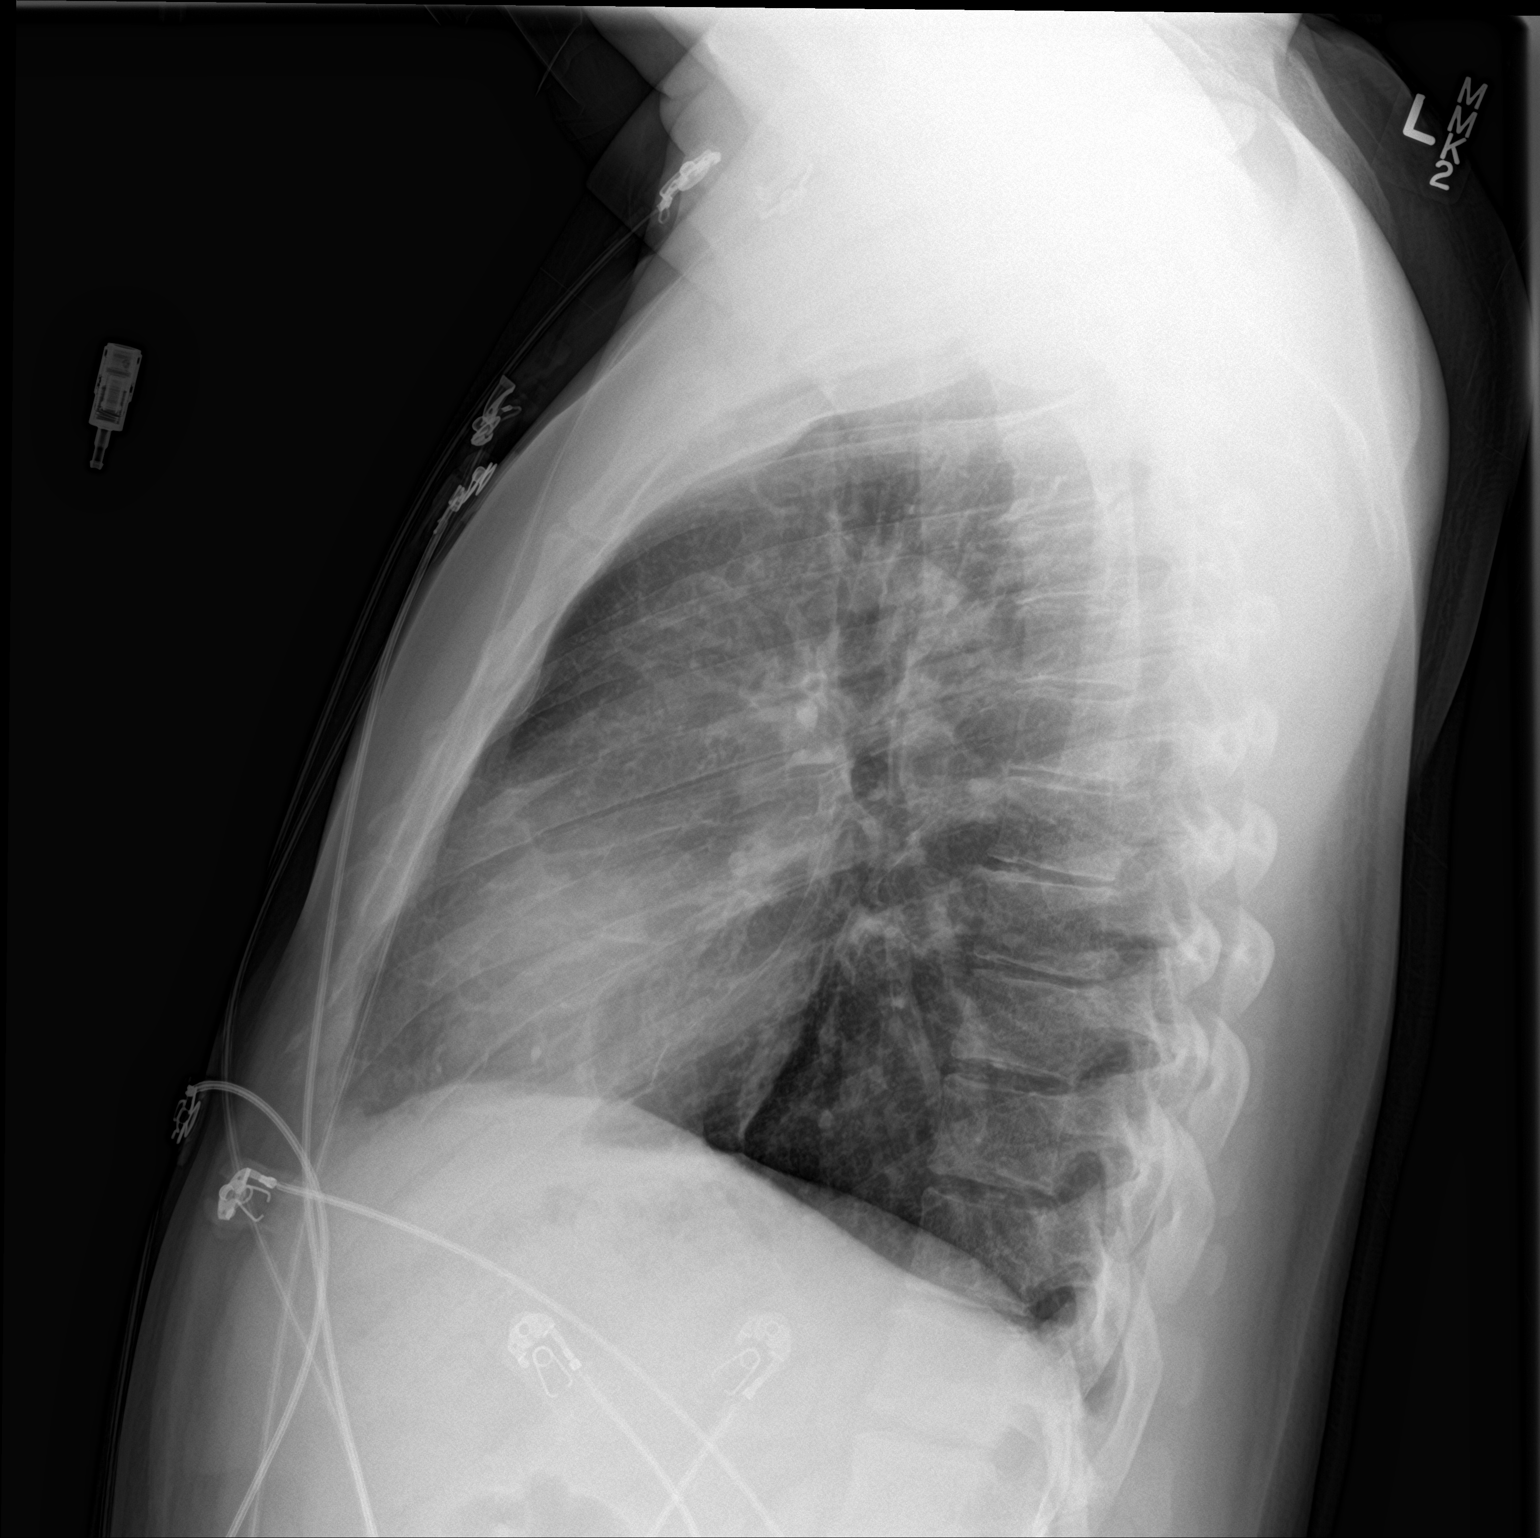

[2 of 2 positions shown; findings below may reference images not displayed]

FINDINGS: Mild bibasilar streaky densities, likely atelectatic changes. There
is no focal consolidation, pleural effusion, or pneumothorax. Stable
cardiac silhouette. No acute osseous pathology.
IMPRESSION: No active cardiopulmonary disease.

## 2022-11-28 ENCOUNTER — Emergency Department (HOSPITAL_COMMUNITY): Payer: Self-pay

## 2022-11-28 ENCOUNTER — Encounter (HOSPITAL_COMMUNITY): Payer: Self-pay

## 2022-11-28 ENCOUNTER — Emergency Department (HOSPITAL_COMMUNITY)
Admission: EM | Admit: 2022-11-28 | Discharge: 2022-11-28 | Payer: Self-pay | Attending: Emergency Medicine | Admitting: Emergency Medicine

## 2022-11-28 ENCOUNTER — Other Ambulatory Visit: Payer: Self-pay

## 2022-11-28 DIAGNOSIS — R000 Tachycardia, unspecified: Secondary | ICD-10-CM | POA: Insufficient documentation

## 2022-11-28 DIAGNOSIS — R110 Nausea: Secondary | ICD-10-CM | POA: Insufficient documentation

## 2022-11-28 DIAGNOSIS — R079 Chest pain, unspecified: Secondary | ICD-10-CM | POA: Insufficient documentation

## 2022-11-28 DIAGNOSIS — R61 Generalized hyperhidrosis: Secondary | ICD-10-CM | POA: Insufficient documentation

## 2022-11-28 LAB — HEPATIC FUNCTION PANEL
ALT: 21 U/L (ref 0–44)
AST: 32 U/L (ref 15–41)
Albumin: 4.2 g/dL (ref 3.5–5.0)
Alkaline Phosphatase: 64 U/L (ref 38–126)
Bilirubin, Direct: 0.5 mg/dL — ABNORMAL HIGH (ref 0.0–0.2)
Indirect Bilirubin: 2.3 mg/dL — ABNORMAL HIGH (ref 0.3–0.9)
Total Bilirubin: 2.8 mg/dL — ABNORMAL HIGH (ref 0.3–1.2)
Total Protein: 7.3 g/dL (ref 6.5–8.1)

## 2022-11-28 LAB — CBC
HCT: 46.6 % (ref 39.0–52.0)
Hemoglobin: 16.2 g/dL (ref 13.0–17.0)
MCH: 30.6 pg (ref 26.0–34.0)
MCHC: 34.8 g/dL (ref 30.0–36.0)
MCV: 87.9 fL (ref 80.0–100.0)
Platelets: 257 10*3/uL (ref 150–400)
RBC: 5.3 MIL/uL (ref 4.22–5.81)
RDW: 12.8 % (ref 11.5–15.5)
WBC: 8.4 10*3/uL (ref 4.0–10.5)
nRBC: 0 % (ref 0.0–0.2)

## 2022-11-28 LAB — BASIC METABOLIC PANEL
Anion gap: 19 — ABNORMAL HIGH (ref 5–15)
BUN: 9 mg/dL (ref 6–20)
CO2: 21 mmol/L — ABNORMAL LOW (ref 22–32)
Calcium: 9.7 mg/dL (ref 8.9–10.3)
Chloride: 100 mmol/L (ref 98–111)
Creatinine, Ser: 0.96 mg/dL (ref 0.61–1.24)
GFR, Estimated: >60 mL/min (ref >60)
Glucose, Bld: 98 mg/dL (ref 70–99)
Potassium: 3.5 mmol/L (ref 3.5–5.1)
Sodium: 140 mmol/L (ref 135–145)

## 2022-11-28 LAB — TROPONIN I (HIGH SENSITIVITY)
Troponin I (High Sensitivity): 4 ng/L (ref <18)
Troponin I (High Sensitivity): 3 ng/L (ref <18)

## 2022-11-28 LAB — ETHANOL: Alcohol, Ethyl (B): <10 mg/dL (ref <10)

## 2022-11-28 MED ORDER — SODIUM CHLORIDE 0.9 % IV BOLUS
1000.0000 mL | Freq: Once | INTRAVENOUS | Status: AC
  Administered 2022-11-28: 1000 mL via INTRAVENOUS

## 2022-11-28 MED ORDER — LORAZEPAM 2 MG/ML IJ SOLN
1.0000 mg | Freq: Once | INTRAMUSCULAR | Status: AC
  Administered 2022-11-28: 1 mg via INTRAVENOUS
  Filled 2022-11-28: qty 1

## 2022-11-28 NOTE — ED Triage Notes (Signed)
Pt did crack cocaine about 1hr ago, he was at jail when he started experiencing chest pain, weakness and nausea. 7/10 chest pain located on the left, non radiating. He did get 324mg  asa before arriving.

## 2022-11-28 NOTE — Discharge Instructions (Signed)
Be sure to follow-up with your physician upon release from custody.  Return here for concerning changes in your condition.

## 2022-11-28 NOTE — ED Provider Notes (Signed)
[facility] Provider Note   CSN: [id number] Arrival date & time: 11/28/22  1633     History  Chief Complaint  Patient presents with   Chest Pain    Cody Meyer is a 42 y.o. male.  HPI Patient presents in the discuss today.  History is obtained by the patient and police officers.  Reportedly patient has been using crack.  For about the last 5 days, and today after being apprehended, he ingested a approximately 1 g rock of unwrapped crack cocaine.  He complains of chest pain, diaphoresis, nausea, unsettled sensation.  He notes drinking alcohol as well, smoking marijuana as well.    Home Medications Prior to Admission medications   Medication Sig Start Date End Date Taking? Authorizing Provider  FLUoxetine (PROZAC) 20 MG capsule Take 1 capsule (20 mg total) daily by mouth. For depression 02/14/17   Armandina Stammer I, NP  nicotine (NICODERM CQ - DOSED IN MG/24 HOURS) 21 mg/24hr patch Place 1 patch (21 mg total) daily onto the skin. For smoking cessation 02/14/17   Armandina Stammer I, NP  phenol (CHLORASEPTIC) 1.4 % LIQD Use as directed 1 spray as needed in the mouth or throat for throat irritation / pain. 02/13/17   Armandina Stammer I, NP  risperiDONE (RISPERDAL) 1 MG tablet Take 1 tablet (1 mg total) at bedtime by mouth. For mood control 02/13/17   Armandina Stammer I, NP  traZODone (DESYREL) 150 MG tablet Take 1 tablet (150 mg total) at bedtime by mouth. For sleep 02/13/17   Armandina Stammer I, NP  Vitamins A & D (VITAMIN A & D) ointment Apply 1 application as needed topically for dry skin. 02/13/17   Sanjuana Kava, NP      Allergies    Patient has no known allergies.    Review of Systems   Review of Systems  Unable to perform ROS: Acuity of condition    Physical Exam Updated Vital Signs BP 118/77   Pulse 82   Temp 98.1 F (36.7 C)   Resp 18   Ht 5\' 8"  (1.727 m)   Wt 88.5 kg   SpO2 97%   BMI 29.67 kg/m  Physical Exam Vitals and nursing  note reviewed.  Constitutional:      Appearance: He is well-developed. He is ill-appearing and diaphoretic.  HENT:     Head: Normocephalic and atraumatic.  Eyes:     Conjunctiva/sclera: Conjunctivae normal.  Cardiovascular:     Rate and Rhythm: Regular rhythm. Tachycardia present.  Pulmonary:     Effort: Pulmonary effort is normal. No respiratory distress.     Breath sounds: No stridor.  Abdominal:     General: There is no distension.  Skin:    General: Skin is warm.  Neurological:     Mental Status: He is alert and oriented to person, place, and time.     ED Results / Procedures / Treatments   Labs (all labs ordered are listed, but only abnormal results are displayed) Labs Reviewed  BASIC METABOLIC PANEL - Abnormal; Notable for the following components:      Result Value   CO2 21 (*)    Anion gap 19 (*)    All other components within normal limits  HEPATIC FUNCTION PANEL - Abnormal; Notable for the following components:   Total Bilirubin 2.8 (*)    Bilirubin, Direct 0.5 (*)    Indirect Bilirubin 2.3 (*)    All other components within normal limits  CBC  ETHANOL  TROPONIN I (HIGH SENSITIVITY)  TROPONIN I (HIGH SENSITIVITY)    EKG EKG Interpretation Date/Time:  Wednesday November 28 2022 16:44:51 EDT Ventricular Rate:  109 PR Interval:  155 QRS Duration:  77 QT Interval:  357 QTC Calculation: 481 R Axis:   77  Text Interpretation: Sinus tachycardia Borderline T wave abnormalities Borderline ST elevation, anterior leads Borderline prolonged QT interval Confirmed by Gerhard Munch [id number]) on 11/28/2022 4:54:36 PM  Radiology DG Chest 2 View  Result Date: 11/28/2022 CLINICAL DATA:  Chest pain EXAM: CHEST - 2 VIEW COMPARISON:  Chest x-ray 02/15/2017 FINDINGS: The heart size and mediastinal contours are within normal limits. Both lungs are clear. The visualized skeletal structures are unremarkable. IMPRESSION: No active cardiopulmonary disease. Electronically Signed    By: Darliss Cheney M.D.   On: 11/28/2022 17:27    Procedures Procedures    Medications Ordered in ED Medications  sodium chloride 0.9 % bolus 1,000 mL (0 mLs Intravenous Stopped 11/28/22 1817)  LORazepam (ATIVAN) injection 1 mg (1 mg Intravenous Given 11/28/22 1717)    ED Course/ Medical Decision Making/ A&P                                 Medical Decision Making Adult male with history of substance abuse presents with chest pain in the context of days of using crack cocaine and after acutely ingesting 1 g while in the process of being apprehended by police officers. Concern for cocaine overdose versus ACS versus electrolyte abnormalities given his ongoing substance abuse. Patient is awake, alert, mentating appropriately, but is diaphoretic, tachycardic. Cardiac 110 sinus tach abnormal Pulse ox 100% room air normal   Amount and/or Complexity of Data Reviewed Independent Historian:     Details: Police officers at bedside External Data Reviewed: notes. Labs: ordered. Decision-making details documented in ED Course. Radiology: ordered and independent interpretation performed. Decision-making details documented in ED Course. ECG/medicine tests: ordered and independent interpretation performed. Decision-making details documented in ED Course.  Risk Prescription drug management. Decision regarding hospitalization. Diagnosis or treatment significantly limited by social determinants of health.   8:08 PM Patient sleeping, vital signs unremarkable.  Second troponin is normal.  This adult male presents after ingesting crack cocaine in the context of using it for the last 5 days.  Patient initially described chest pain but was sleeping throughout hours of ED monitoring, without any evidence for decompensation, and with vital sign normalization.  No evidence for dissection, patient in no distress, patient appropriate for discharge with follow-up once he is finished with the police  involvement.        Final Clinical Impression(s) / ED Diagnoses Final diagnoses:  Chest pain, unspecified type    Rx / DC Orders ED Discharge Orders     None         Gerhard Munch, MD 11/28/22 2008

## 2022-11-28 NOTE — ED Notes (Signed)
Discharge instructions discussed with pt. Pt discharged in police custody.
